# Patient Record
Sex: Female | Born: 1945 | Race: White | Hispanic: No | Marital: Married | Smoking: Never smoker
Health system: Southern US, Community
[De-identification: ages and names within clinical notes are randomized; demographics above are authoritative.]

## PROBLEM LIST (undated history)

## (undated) DIAGNOSIS — Z87442 Personal history of urinary calculi: Secondary | ICD-10-CM

## (undated) DIAGNOSIS — E119 Type 2 diabetes mellitus without complications: Secondary | ICD-10-CM

## (undated) DIAGNOSIS — M1990 Unspecified osteoarthritis, unspecified site: Secondary | ICD-10-CM

## (undated) DIAGNOSIS — I10 Essential (primary) hypertension: Secondary | ICD-10-CM

## (undated) HISTORY — PX: TUBAL LIGATION: SHX77

## (undated) HISTORY — PX: TONSILLECTOMY: SUR1361

---

## 1992-11-28 HISTORY — PX: CHOLECYSTECTOMY: SHX55

## 2016-06-03 ENCOUNTER — Other Ambulatory Visit: Payer: Self-pay | Admitting: Orthopedic Surgery

## 2016-07-01 ENCOUNTER — Encounter (HOSPITAL_COMMUNITY): Payer: Self-pay

## 2016-07-01 ENCOUNTER — Other Ambulatory Visit (HOSPITAL_COMMUNITY): Payer: Self-pay | Admitting: *Deleted

## 2016-07-01 ENCOUNTER — Encounter (HOSPITAL_COMMUNITY)
Admission: RE | Admit: 2016-07-01 | Discharge: 2016-07-01 | Disposition: A | Discharge status: Home / Self Care | Payer: Medicare Other | Source: Ambulatory Visit | Attending: Orthopedic Surgery | Admitting: Orthopedic Surgery

## 2016-07-01 ENCOUNTER — Ambulatory Visit (HOSPITAL_COMMUNITY)
Admission: RE | Admit: 2016-07-01 | Discharge: 2016-07-01 | Disposition: A | Discharge status: Home / Self Care | Payer: Medicare Other | Source: Ambulatory Visit | Attending: Orthopedic Surgery | Admitting: Orthopedic Surgery

## 2016-07-01 DIAGNOSIS — I10 Essential (primary) hypertension: Secondary | ICD-10-CM | POA: Insufficient documentation

## 2016-07-01 DIAGNOSIS — Z01818 Encounter for other preprocedural examination: Secondary | ICD-10-CM | POA: Diagnosis present

## 2016-07-01 DIAGNOSIS — I493 Ventricular premature depolarization: Secondary | ICD-10-CM | POA: Insufficient documentation

## 2016-07-01 DIAGNOSIS — I878 Other specified disorders of veins: Secondary | ICD-10-CM | POA: Insufficient documentation

## 2016-07-01 DIAGNOSIS — I700 Atherosclerosis of aorta: Secondary | ICD-10-CM | POA: Diagnosis not present

## 2016-07-01 DIAGNOSIS — Z01812 Encounter for preprocedural laboratory examination: Secondary | ICD-10-CM | POA: Insufficient documentation

## 2016-07-01 DIAGNOSIS — R9431 Abnormal electrocardiogram [ECG] [EKG]: Secondary | ICD-10-CM | POA: Insufficient documentation

## 2016-07-01 DIAGNOSIS — I517 Cardiomegaly: Secondary | ICD-10-CM | POA: Insufficient documentation

## 2016-07-01 DIAGNOSIS — Z01810 Encounter for preprocedural cardiovascular examination: Secondary | ICD-10-CM | POA: Diagnosis not present

## 2016-07-01 HISTORY — DX: Unspecified osteoarthritis, unspecified site: M19.90

## 2016-07-01 HISTORY — DX: Type 2 diabetes mellitus without complications: E11.9

## 2016-07-01 HISTORY — DX: Essential (primary) hypertension: I10

## 2016-07-01 HISTORY — DX: Personal history of urinary calculi: Z87.442

## 2016-07-01 LAB — COMPREHENSIVE METABOLIC PANEL
ALBUMIN: 3.9 g/dL (ref 3.5–5.0)
ALT: 21 U/L (ref 14–54)
AST: 24 U/L (ref 15–41)
Alkaline Phosphatase: 83 U/L (ref 38–126)
Anion gap: 11 (ref 5–15)
BILIRUBIN TOTAL: 0.5 mg/dL (ref 0.3–1.2)
BUN: 28 mg/dL — AB (ref 6–20)
CHLORIDE: 103 mmol/L (ref 101–111)
CO2: 25 mmol/L (ref 22–32)
CREATININE: 1.33 mg/dL — AB (ref 0.44–1.00)
Calcium: 9.3 mg/dL (ref 8.9–10.3)
GFR calc Af Amer: 46 mL/min — ABNORMAL LOW (ref >60)
GFR calc non Af Amer: 40 mL/min — ABNORMAL LOW (ref >60)
GLUCOSE: 268 mg/dL — AB (ref 65–99)
POTASSIUM: 4.1 mmol/L (ref 3.5–5.1)
Sodium: 139 mmol/L (ref 135–145)
Total Protein: 6.9 g/dL (ref 6.5–8.1)

## 2016-07-01 LAB — URINALYSIS, ROUTINE W REFLEX MICROSCOPIC
BILIRUBIN URINE: NEGATIVE
Glucose, UA: 250 mg/dL — AB
HGB URINE DIPSTICK: NEGATIVE
KETONES UR: NEGATIVE mg/dL
NITRITE: NEGATIVE
PH: 5.5 (ref 5.0–8.0)
PROTEIN: 100 mg/dL — AB
SPECIFIC GRAVITY, URINE: 1.018 (ref 1.005–1.030)

## 2016-07-01 LAB — CBC WITH DIFFERENTIAL/PLATELET
BASOS PCT: 0 %
Basophils Absolute: 0 10*3/uL (ref 0.0–0.1)
EOS ABS: 0.4 10*3/uL (ref 0.0–0.7)
Eosinophils Relative: 3 %
HEMATOCRIT: 36.4 % (ref 36.0–46.0)
Hemoglobin: 11.5 g/dL — ABNORMAL LOW (ref 12.0–15.0)
LYMPHS ABS: 3.7 10*3/uL (ref 0.7–4.0)
Lymphocytes Relative: 26 %
MCH: 25.8 pg — ABNORMAL LOW (ref 26.0–34.0)
MCHC: 31.6 g/dL (ref 30.0–36.0)
MCV: 81.6 fL (ref 78.0–100.0)
MONO ABS: 1.4 10*3/uL — AB (ref 0.1–1.0)
Monocytes Relative: 10 %
NEUTROS ABS: 8.7 10*3/uL — AB (ref 1.7–7.7)
Neutrophils Relative %: 61 %
PLATELETS: 453 10*3/uL — AB (ref 150–400)
RBC: 4.46 MIL/uL (ref 3.87–5.11)
RDW: 13.8 % (ref 11.5–15.5)
WBC: 14.2 10*3/uL — ABNORMAL HIGH (ref 4.0–10.5)

## 2016-07-01 LAB — PROTIME-INR
INR: 1.01
Prothrombin Time: 13.3 seconds (ref 11.4–15.2)

## 2016-07-01 LAB — SURGICAL PCR SCREEN
MRSA, PCR: NEGATIVE
Staphylococcus aureus: NEGATIVE

## 2016-07-01 LAB — GLUCOSE, CAPILLARY: Glucose-Capillary: 241 mg/dL — ABNORMAL HIGH (ref 65–99)

## 2016-07-01 LAB — URINE MICROSCOPIC-ADD ON: RBC / HPF: NONE SEEN RBC/hpf (ref 0–5)

## 2016-07-01 NOTE — Pre-Procedure Instructions (Addendum)
Brandy Cunningham  07/01/2016      [facility] - [place], [place] - [address] [place] [address] Phone: [phone] Fax: [phone]    Your procedure is scheduled on Monday, July 11, 2016 .  Report to [facility] Entrance "A" Admitting Office at 6:30 AM.  Call this number if you have problems the morning of surgery: [phone]  Any questions prior to day of surgery, please call [phone] between 8 & 4 PM.   Remember:  Do not eat food or drink liquids after midnight Sunday, 07/10/16.  Take these medicines the morning of surgery with A SIP OF WATER: , Cetirizine (Zyrtec)  Stop Herbal medications and all vitamins/over the counter supplements 7 days prior to surgery(07/04/16). Do not use Aspirin Products or NSAIDS (Aleve, Ibuprofen, etc.) 7 days prior to surgery.    How to Manage Your Diabetes Before Surgery   Why is it important to control my blood sugar before and after surgery?   Improving blood sugar levels before and after surgery helps healing and can limit problems.  A way of improving blood sugar control is eating a healthy diet by:  - Eating less sugar and carbohydrates  - Increasing activity/exercise  - Talk with your doctor about reaching your blood sugar goals  High blood sugars (greater than 180 mg/dL) can raise your risk of infections and slow down your recovery so you will need to focus on controlling your diabetes during the weeks before surgery.  Make sure that the doctor who takes care of your diabetes knows about your planned surgery including the date and location.  How do I manage my blood sugars before surgery?   Check your blood sugar at least 4 times a day, 2 days before surgery to make sure that they are not too high or low.  Check your blood sugar the morning of your surgery when you wake up and every 2 hours until you get to the Short-Stay unit.  Treat a low blood sugar  (less than 70 mg/dL) with 1/2 cup of clear juice (cranberry or apple), 4 glucose tablets, OR glucose gel.  Recheck blood sugar in 15 minutes after treatment (to make sure it is greater than 70 mg/dL).  If blood sugar is not greater than 70 mg/dL on re-check, call [phone] for further instructions.   Report your blood sugar to the Short-Stay nurse when you get to Short-Stay.  References:  [facility], 2007 "How to Manage your Diabetes Before and After Surgery".  What do I do about my diabetes medications?   Do not take oral diabetes medicines (pills) the morning of surgery.(no metformin)    THE NIGHT BEFORE SURGERY, take 30 units of Lantus Insulin, REG LUNCH DOSE  .     Do not wear jewelry, make-up or nail polish.  Do not wear lotions, powders, or perfumes.  You may wear deodorant.  Do not shave 48 hours prior to surgery.    Do not bring valuables to the hospital.  [facility] is not responsible for any belongings or valuables.  Contacts, dentures or bridgework may not be worn into surgery.  Leave your suitcase in the car.  After surgery it may be brought to your room.  For patients admitted to the hospital, discharge time will be determined by your treatment team.  Special instructions:  Brandy Cunningham - Preparing for Surgery  Before  surgery, you can play an important role.  Because skin is not sterile, your skin needs to be as free of germs as possible.  You can reduce the number of germs on you skin by washing with CHG (chlorahexidine gluconate) soap before surgery.  CHG is an antiseptic cleaner which kills germs and bonds with the skin to continue killing germs even after washing.  Please DO NOT use if you have an allergy to CHG or antibacterial soaps.  If your skin becomes reddened/irritated stop using the CHG and inform your nurse when you arrive at Short Stay.  Do not shave (including legs and underarms) for at least 48 hours prior to the first  CHG shower.  You may shave your face.  Please follow these instructions carefully:   1.  Shower with CHG Soap the night before surgery and the                                morning of Surgery.  2.  If you choose to wash your hair, wash your hair first as usual with your       normal shampoo.  3.  After you shampoo, rinse your hair and body thoroughly to remove the shampoo.  4.  Use CHG as you would any other liquid soap.  You can apply chg directly       to the skin and wash gently with scrungie or a clean washcloth.  5.  Apply the CHG Soap to your body ONLY FROM THE NECK DOWN.        Do not use on open wounds or open sores.  Avoid contact with your eyes, ears, mouth and genitals (private parts).  Wash genitals (private parts) with your normal soap.  6.  Wash thoroughly, paying special attention to the area where your surgery        will be performed.  7.  Thoroughly rinse your body with warm water from the neck down.  8.  DO NOT shower/wash with your normal soap after using and rinsing off       the CHG Soap.  9.  Pat yourself dry with a clean towel.            10.  Wear clean pajamas.            11.  Place clean sheets on your bed the night of your first shower and do not        sleep with pets.  Day of Surgery  Do not apply any lotions the morning of surgery.  Please wear clean clothes to the hospital.   Please read over the following fact sheets that you were given. Pain Booklet, Coughing and Deep Breathing, MRSA Information and Surgical Site Infection Prevention

## 2016-07-02 LAB — URINE CULTURE

## 2016-07-02 LAB — HEMOGLOBIN A1C
Hgb A1c MFr Bld: 7.8 % — ABNORMAL HIGH (ref 4.8–5.6)
Mean Plasma Glucose: 177 mg/dL

## 2016-07-05 NOTE — Progress Notes (Signed)
Anesthesia Chart Review:  Pt is a 70 year old female scheduled for R total knee arthroplasty on 07/11/2016 with Dannielle HuhSteve Lucey, MD.   PMH includes:  HTN, DM.  Never smoker.   Medications include: amlodipine, iron, humulin 70/30, hctz, lantus, lisinopril, metformin, simvastatin.   Preoperative labs reviewed.  HgbA1c 7.8, glucose 268  Chest x-ray 07/01/16 reviewed. 1. Mild cardiomegaly and mild pulmonary vascular congestion. 2. Aortic atherosclerosis.  EKG 07/01/16: Sinus rhythm with occasional PVCs. Low voltage QRS. ST & T wave abnormality, consider lateral ischemia  Reviewed with Dr. Maple HudsonMoser.   If no changes, I anticipate pt can proceed with surgery as scheduled.   Rica Mastngela Kabbe, FNP-BC [facility]/Anesthesiology Phone: [phone] 07/05/2016 5:15 PM

## 2016-07-08 MED ORDER — BUPIVACAINE LIPOSOME 1.3 % IJ SUSP
20.0000 mL | INTRAMUSCULAR | Status: AC
  Administered 2016-07-11: 20 mL
  Filled 2016-07-08: qty 20

## 2016-07-08 MED ORDER — TRANEXAMIC ACID 1000 MG/10ML IV SOLN
1000.0000 mg | INTRAVENOUS | Status: AC
  Administered 2016-07-11: 1000 mg via INTRAVENOUS
  Filled 2016-07-08: qty 10

## 2016-07-10 MED ORDER — CEFAZOLIN SODIUM-DEXTROSE 2-4 GM/100ML-% IV SOLN
2.0000 g | INTRAVENOUS | Status: AC
Start: 2016-07-11 — End: 2016-07-11
  Administered 2016-07-11: 2 g via INTRAVENOUS
  Filled 2016-07-10: qty 100

## 2016-07-10 NOTE — Anesthesia Preprocedure Evaluation (Addendum)
Anesthesia Evaluation  Patient identified by MRN, date of birth, ID band Patient awake    Reviewed: Allergy & Precautions, NPO status , Patient's Chart, lab work & pertinent test results  Airway Mallampati: I  TM Distance: >3 FB Neck ROM: Full    Dental  (+) Teeth Intact, Dental Advisory Given, Missing   Pulmonary neg pulmonary ROS,    breath sounds clear to auscultation       Cardiovascular hypertension, Pt. on medications  Rhythm:Regular Rate:Normal     Neuro/Psych negative neurological ROS  negative psych ROS   GI/Hepatic negative GI ROS, Neg liver ROS,   Endo/Other  diabetes, Type 2, Oral Hypoglycemic Agents  Renal/GU   negative genitourinary   Musculoskeletal  (+) Arthritis , Osteoarthritis,    Abdominal   Peds negative pediatric ROS (+)  Hematology negative hematology ROS (+)   Anesthesia Other Findings   Reproductive/Obstetrics negative OB ROS                            Lab Results  Component Value Date   WBC 14.2 (H) 07/01/2016   HGB 11.5 (L) 07/01/2016   HCT 36.4 07/01/2016   MCV 81.6 07/01/2016   PLT 453 (H) 07/01/2016   Lab Results  Component Value Date   INR 1.01 07/01/2016   06/2016 EKG: normal sinus rhythm, occasional PVC noted, unifocal.   Anesthesia Physical Anesthesia Plan  ASA: III  Anesthesia Plan: Spinal   Post-op Pain Management:    Induction: Intravenous  Airway Management Planned: Natural Airway  Additional Equipment:   Intra-op Plan:   Post-operative Plan:   Informed Consent: I have reviewed the patients History and Physical, chart, labs and discussed the procedure including the risks, benefits and alternatives for the proposed anesthesia with the patient or authorized representative who has indicated his/her understanding and acceptance.   Dental advisory given  Plan Discussed with: CRNA  Anesthesia Plan Comments:          Anesthesia Quick Evaluation

## 2016-07-11 ENCOUNTER — Inpatient Hospital Stay (HOSPITAL_COMMUNITY): Payer: Medicare Other | Admitting: Anesthesiology

## 2016-07-11 ENCOUNTER — Inpatient Hospital Stay (HOSPITAL_COMMUNITY)
Admission: RE | Admit: 2016-07-11 | Discharge: 2016-07-12 | DRG: 470 | Disposition: A | Discharge status: Home Health | Payer: Medicare Other | Source: Ambulatory Visit | Attending: Orthopedic Surgery | Admitting: Orthopedic Surgery

## 2016-07-11 ENCOUNTER — Encounter (HOSPITAL_COMMUNITY): Payer: Self-pay | Admitting: *Deleted

## 2016-07-11 ENCOUNTER — Encounter (HOSPITAL_COMMUNITY)
Admission: RE | Disposition: A | Discharge status: Home Health | Payer: Self-pay | Source: Ambulatory Visit | Attending: Orthopedic Surgery

## 2016-07-11 ENCOUNTER — Inpatient Hospital Stay (HOSPITAL_COMMUNITY): Payer: Medicare Other | Admitting: Emergency Medicine

## 2016-07-11 DIAGNOSIS — Z881 Allergy status to other antibiotic agents status: Secondary | ICD-10-CM

## 2016-07-11 DIAGNOSIS — Z885 Allergy status to narcotic agent status: Secondary | ICD-10-CM | POA: Diagnosis not present

## 2016-07-11 DIAGNOSIS — F17290 Nicotine dependence, other tobacco product, uncomplicated: Secondary | ICD-10-CM | POA: Diagnosis present

## 2016-07-11 DIAGNOSIS — M1711 Unilateral primary osteoarthritis, right knee: Secondary | ICD-10-CM | POA: Diagnosis present

## 2016-07-11 DIAGNOSIS — Z79899 Other long term (current) drug therapy: Secondary | ICD-10-CM

## 2016-07-11 DIAGNOSIS — D62 Acute posthemorrhagic anemia: Secondary | ICD-10-CM | POA: Diagnosis not present

## 2016-07-11 DIAGNOSIS — I10 Essential (primary) hypertension: Secondary | ICD-10-CM | POA: Diagnosis present

## 2016-07-11 DIAGNOSIS — Z794 Long term (current) use of insulin: Secondary | ICD-10-CM

## 2016-07-11 DIAGNOSIS — E119 Type 2 diabetes mellitus without complications: Secondary | ICD-10-CM | POA: Diagnosis present

## 2016-07-11 DIAGNOSIS — Z96659 Presence of unspecified artificial knee joint: Secondary | ICD-10-CM

## 2016-07-11 HISTORY — PX: TOTAL KNEE ARTHROPLASTY: SHX125

## 2016-07-11 LAB — GLUCOSE, CAPILLARY
GLUCOSE-CAPILLARY: 221 mg/dL — AB (ref 65–99)
Glucose-Capillary: 173 mg/dL — ABNORMAL HIGH (ref 65–99)
Glucose-Capillary: 261 mg/dL — ABNORMAL HIGH (ref 65–99)
Glucose-Capillary: 165 mg/dL — ABNORMAL HIGH (ref 65–99)
Glucose-Capillary: 217 mg/dL — ABNORMAL HIGH (ref 65–99)

## 2016-07-11 SURGERY — ARTHROPLASTY, KNEE, TOTAL
Anesthesia: Spinal | Site: Knee | Laterality: Right

## 2016-07-11 MED ORDER — LISINOPRIL 40 MG PO TABS
40.0000 mg | ORAL_TABLET | Freq: Every day | ORAL | Status: DC
  Administered 2016-07-11 – 2016-07-12 (×2): 40 mg via ORAL
  Filled 2016-07-11 (×2): qty 1

## 2016-07-11 MED ORDER — PROPOFOL 1000 MG/100ML IV EMUL
INTRAVENOUS | Status: AC
  Filled 2016-07-11: qty 200

## 2016-07-11 MED ORDER — INSULIN ASPART 100 UNIT/ML ~~LOC~~ SOLN
0.0000 [IU] | Freq: Three times a day (TID) | SUBCUTANEOUS | Status: DC
  Administered 2016-07-11: 8 [IU] via SUBCUTANEOUS
  Administered 2016-07-11 – 2016-07-12 (×2): 5 [IU] via SUBCUTANEOUS
  Administered 2016-07-12: 3 [IU] via SUBCUTANEOUS

## 2016-07-11 MED ORDER — SIMVASTATIN 40 MG PO TABS
40.0000 mg | ORAL_TABLET | Freq: Every day | ORAL | Status: DC
  Administered 2016-07-11: 40 mg via ORAL
  Filled 2016-07-11: qty 1

## 2016-07-11 MED ORDER — TRANEXAMIC ACID 1000 MG/10ML IV SOLN
1000.0000 mg | Freq: Once | INTRAVENOUS | Status: AC
  Administered 2016-07-11: 1000 mg via INTRAVENOUS
  Filled 2016-07-11 (×2): qty 10

## 2016-07-11 MED ORDER — SODIUM CHLORIDE 0.9 % IJ SOLN
INTRAMUSCULAR | Status: DC | PRN
  Administered 2016-07-11: 20 mL

## 2016-07-11 MED ORDER — BUPIVACAINE IN DEXTROSE 0.75-8.25 % IT SOLN
INTRATHECAL | Status: DC | PRN
  Administered 2016-07-11: 2 mL via INTRATHECAL

## 2016-07-11 MED ORDER — BUPIVACAINE-EPINEPHRINE (PF) 0.25% -1:200000 IJ SOLN
INTRAMUSCULAR | Status: AC
  Filled 2016-07-11: qty 30

## 2016-07-11 MED ORDER — OXYCODONE HCL ER 10 MG PO T12A
10.0000 mg | EXTENDED_RELEASE_TABLET | Freq: Two times a day (BID) | ORAL | Status: DC
  Administered 2016-07-11 – 2016-07-12 (×3): 10 mg via ORAL
  Filled 2016-07-11 (×3): qty 1

## 2016-07-11 MED ORDER — ASPIRIN EC 325 MG PO TBEC
325.0000 mg | DELAYED_RELEASE_TABLET | Freq: Two times a day (BID) | ORAL | Status: DC
  Administered 2016-07-11 – 2016-07-12 (×2): 325 mg via ORAL
  Filled 2016-07-11 (×2): qty 1

## 2016-07-11 MED ORDER — PROPOFOL 500 MG/50ML IV EMUL
INTRAVENOUS | Status: DC | PRN
Start: 2016-07-11 — End: 2016-07-11
  Administered 2016-07-11: 75 ug/kg/min via INTRAVENOUS

## 2016-07-11 MED ORDER — ONDANSETRON HCL 4 MG/2ML IJ SOLN
INTRAMUSCULAR | Status: AC
  Filled 2016-07-11: qty 2

## 2016-07-11 MED ORDER — CHLORHEXIDINE GLUCONATE 4 % EX LIQD: 60.0000 mL | Freq: Once | CUTANEOUS | Status: DC

## 2016-07-11 MED ORDER — PROMETHAZINE HCL 25 MG/ML IJ SOLN: 6.2500 mg | INTRAMUSCULAR | Status: DC | PRN

## 2016-07-11 MED ORDER — AMLODIPINE BESYLATE 10 MG PO TABS
10.0000 mg | ORAL_TABLET | Freq: Every day | ORAL | Status: DC
  Administered 2016-07-11 – 2016-07-12 (×2): 10 mg via ORAL
  Filled 2016-07-11 (×2): qty 1

## 2016-07-11 MED ORDER — BUPIVACAINE-EPINEPHRINE (PF) 0.5% -1:200000 IJ SOLN
INTRAMUSCULAR | Status: DC | PRN
  Administered 2016-07-11: 30 mL via PERINEURAL

## 2016-07-11 MED ORDER — DEXTROSE 5 % IV SOLN
500.0000 mg | Freq: Four times a day (QID) | INTRAVENOUS | Status: DC | PRN
  Filled 2016-07-11: qty 5

## 2016-07-11 MED ORDER — SODIUM CHLORIDE 0.9 % IV SOLN
INTRAVENOUS | Status: DC
  Administered 2016-07-11: 18:00:00 via INTRAVENOUS

## 2016-07-11 MED ORDER — INSULIN ASPART 100 UNIT/ML ~~LOC~~ SOLN
0.0000 [IU] | Freq: Every day | SUBCUTANEOUS | Status: DC
  Administered 2016-07-11: 2 [IU] via SUBCUTANEOUS

## 2016-07-11 MED ORDER — ACETAMINOPHEN 500 MG PO TABS
1000.0000 mg | ORAL_TABLET | Freq: Four times a day (QID) | ORAL | Status: AC
  Administered 2016-07-11 – 2016-07-12 (×4): 1000 mg via ORAL
  Filled 2016-07-11 (×4): qty 2

## 2016-07-11 MED ORDER — ZOLPIDEM TARTRATE 5 MG PO TABS: 5.0000 mg | ORAL_TABLET | Freq: Every evening | ORAL | Status: DC | PRN

## 2016-07-11 MED ORDER — PHENYLEPHRINE HCL 10 MG/ML IJ SOLN
INTRAMUSCULAR | Status: DC | PRN
  Administered 2016-07-11 (×2): 80 ug via INTRAVENOUS
  Administered 2016-07-11 (×2): 40 ug via INTRAVENOUS
  Administered 2016-07-11: 120 ug via INTRAVENOUS
  Administered 2016-07-11: 160 ug via INTRAVENOUS

## 2016-07-11 MED ORDER — DEXAMETHASONE SODIUM PHOSPHATE 10 MG/ML IJ SOLN
10.0000 mg | Freq: Once | INTRAMUSCULAR | Status: AC
  Administered 2016-07-12: 10 mg via INTRAVENOUS
  Filled 2016-07-11: qty 1

## 2016-07-11 MED ORDER — FENTANYL CITRATE (PF) 100 MCG/2ML IJ SOLN
INTRAMUSCULAR | Status: DC | PRN
  Administered 2016-07-11 (×2): 25 ug via INTRAVENOUS

## 2016-07-11 MED ORDER — FENTANYL CITRATE (PF) 250 MCG/5ML IJ SOLN
INTRAMUSCULAR | Status: AC
  Filled 2016-07-11: qty 5

## 2016-07-11 MED ORDER — PROPOFOL 10 MG/ML IV BOLUS
INTRAVENOUS | Status: AC
  Filled 2016-07-11: qty 20

## 2016-07-11 MED ORDER — HYDROCHLOROTHIAZIDE 25 MG PO TABS
25.0000 mg | ORAL_TABLET | Freq: Every day | ORAL | Status: DC
  Administered 2016-07-11 – 2016-07-12 (×2): 25 mg via ORAL
  Filled 2016-07-11 (×2): qty 1

## 2016-07-11 MED ORDER — PROPOFOL 10 MG/ML IV BOLUS
INTRAVENOUS | Status: DC | PRN
  Administered 2016-07-11 (×2): 20 mg via INTRAVENOUS

## 2016-07-11 MED ORDER — ACETAMINOPHEN 650 MG RE SUPP: 650.0000 mg | Freq: Four times a day (QID) | RECTAL | Status: DC | PRN

## 2016-07-11 MED ORDER — SENNOSIDES-DOCUSATE SODIUM 8.6-50 MG PO TABS: 1.0000 | ORAL_TABLET | Freq: Every evening | ORAL | Status: DC | PRN

## 2016-07-11 MED ORDER — METHOCARBAMOL 500 MG PO TABS
ORAL_TABLET | ORAL | Status: AC
  Filled 2016-07-11: qty 1

## 2016-07-11 MED ORDER — MEPERIDINE HCL 25 MG/ML IJ SOLN: 6.2500 mg | INTRAMUSCULAR | Status: DC | PRN

## 2016-07-11 MED ORDER — 0.9 % SODIUM CHLORIDE (POUR BTL) OPTIME
TOPICAL | Status: DC | PRN
  Administered 2016-07-11: 1000 mL

## 2016-07-11 MED ORDER — METHOCARBAMOL 500 MG PO TABS
500.0000 mg | ORAL_TABLET | Freq: Four times a day (QID) | ORAL | Status: DC | PRN
  Administered 2016-07-11 – 2016-07-12 (×4): 500 mg via ORAL
  Filled 2016-07-11 (×3): qty 1

## 2016-07-11 MED ORDER — INSULIN ASPART 100 UNIT/ML ~~LOC~~ SOLN
4.0000 [IU] | Freq: Three times a day (TID) | SUBCUTANEOUS | Status: DC
  Administered 2016-07-11 – 2016-07-12 (×3): 4 [IU] via SUBCUTANEOUS

## 2016-07-11 MED ORDER — OXYCODONE HCL 5 MG PO TABS
ORAL_TABLET | ORAL | Status: AC
  Filled 2016-07-11: qty 2

## 2016-07-11 MED ORDER — ALUM & MAG HYDROXIDE-SIMETH 200-200-20 MG/5ML PO SUSP: 30.0000 mL | ORAL | Status: DC | PRN

## 2016-07-11 MED ORDER — BISACODYL 5 MG PO TBEC: 5.0000 mg | DELAYED_RELEASE_TABLET | Freq: Every day | ORAL | Status: DC | PRN

## 2016-07-11 MED ORDER — PHENOL 1.4 % MT LIQD: 1.0000 | OROMUCOSAL | Status: DC | PRN

## 2016-07-11 MED ORDER — ONDANSETRON HCL 4 MG PO TABS: 4.0000 mg | ORAL_TABLET | Freq: Four times a day (QID) | ORAL | Status: DC | PRN

## 2016-07-11 MED ORDER — ONDANSETRON HCL 4 MG/2ML IJ SOLN
INTRAMUSCULAR | Status: DC | PRN
  Administered 2016-07-11: 4 mg via INTRAVENOUS

## 2016-07-11 MED ORDER — BUPIVACAINE-EPINEPHRINE (PF) 0.25% -1:200000 IJ SOLN
INTRAMUSCULAR | Status: DC | PRN
  Administered 2016-07-11: 30 mL

## 2016-07-11 MED ORDER — METOCLOPRAMIDE HCL 5 MG PO TABS
5.0000 mg | ORAL_TABLET | Freq: Three times a day (TID) | ORAL | Status: DC | PRN
Start: 2016-07-11 — End: 2016-07-12

## 2016-07-11 MED ORDER — DIPHENHYDRAMINE HCL 12.5 MG/5ML PO ELIX: 12.5000 mg | ORAL_SOLUTION | ORAL | Status: DC | PRN

## 2016-07-11 MED ORDER — INSULIN GLARGINE 100 UNIT/ML ~~LOC~~ SOLN
60.0000 [IU] | Freq: Every day | SUBCUTANEOUS | Status: DC
  Administered 2016-07-11: 60 [IU] via SUBCUTANEOUS
  Filled 2016-07-11 (×2): qty 0.6

## 2016-07-11 MED ORDER — ACETAMINOPHEN 500 MG PO TABS
1000.0000 mg | ORAL_TABLET | Freq: Once | ORAL | Status: AC
  Administered 2016-07-11: 1000 mg via ORAL
  Filled 2016-07-11: qty 2

## 2016-07-11 MED ORDER — METFORMIN HCL 500 MG PO TABS
1000.0000 mg | ORAL_TABLET | Freq: Two times a day (BID) | ORAL | Status: DC
  Administered 2016-07-11 – 2016-07-12 (×2): 1000 mg via ORAL
  Filled 2016-07-11 (×2): qty 2

## 2016-07-11 MED ORDER — HYDROMORPHONE HCL 1 MG/ML IJ SOLN
1.0000 mg | INTRAMUSCULAR | Status: DC | PRN
  Administered 2016-07-11 (×2): 1 mg via INTRAVENOUS
  Filled 2016-07-11 (×2): qty 1

## 2016-07-11 MED ORDER — ONDANSETRON HCL 4 MG/2ML IJ SOLN
4.0000 mg | Freq: Four times a day (QID) | INTRAMUSCULAR | Status: DC | PRN
  Administered 2016-07-11 – 2016-07-12 (×3): 4 mg via INTRAVENOUS
  Filled 2016-07-11 (×3): qty 2

## 2016-07-11 MED ORDER — DOCUSATE SODIUM 100 MG PO CAPS
100.0000 mg | ORAL_CAPSULE | Freq: Two times a day (BID) | ORAL | Status: DC
  Administered 2016-07-11 – 2016-07-12 (×2): 100 mg via ORAL
  Filled 2016-07-11 (×2): qty 1

## 2016-07-11 MED ORDER — MENTHOL 3 MG MT LOZG: 1.0000 | LOZENGE | OROMUCOSAL | Status: DC | PRN

## 2016-07-11 MED ORDER — PHENYLEPHRINE HCL 10 MG/ML IJ SOLN
INTRAVENOUS | Status: DC | PRN
  Administered 2016-07-11: 50 ug/min via INTRAVENOUS

## 2016-07-11 MED ORDER — INSULIN ASPART 100 UNIT/ML ~~LOC~~ SOLN
SUBCUTANEOUS | Status: AC
  Administered 2016-07-11: 2 [IU] via SUBCUTANEOUS
  Filled 2016-07-11: qty 1

## 2016-07-11 MED ORDER — LACTATED RINGERS IV SOLN
INTRAVENOUS | Status: DC | PRN
  Administered 2016-07-11 (×2): via INTRAVENOUS

## 2016-07-11 MED ORDER — LACTATED RINGERS IV SOLN: INTRAVENOUS | Status: DC

## 2016-07-11 MED ORDER — FLEET ENEMA 7-19 GM/118ML RE ENEM: 1.0000 | ENEMA | Freq: Once | RECTAL | Status: DC | PRN

## 2016-07-11 MED ORDER — MIDAZOLAM HCL 2 MG/2ML IJ SOLN
INTRAMUSCULAR | Status: AC
  Filled 2016-07-11: qty 2

## 2016-07-11 MED ORDER — ACETAMINOPHEN 325 MG PO TABS: 650.0000 mg | ORAL_TABLET | Freq: Four times a day (QID) | ORAL | Status: DC | PRN

## 2016-07-11 MED ORDER — CHLORHEXIDINE GLUCONATE 4 % EX LIQD
60.0000 mL | Freq: Once | CUTANEOUS | Status: DC
Start: 2016-07-11 — End: 2016-07-11

## 2016-07-11 MED ORDER — OXYCODONE HCL 5 MG PO TABS
5.0000 mg | ORAL_TABLET | ORAL | Status: DC | PRN
  Administered 2016-07-11 – 2016-07-12 (×4): 10 mg via ORAL
  Filled 2016-07-11 (×5): qty 2

## 2016-07-11 MED ORDER — SODIUM CHLORIDE 0.9 % IR SOLN
Status: DC | PRN
  Administered 2016-07-11: 1000 mL

## 2016-07-11 MED ORDER — METOCLOPRAMIDE HCL 5 MG/ML IJ SOLN
5.0000 mg | Freq: Three times a day (TID) | INTRAMUSCULAR | Status: DC | PRN
Start: 2016-07-11 — End: 2016-07-12
  Administered 2016-07-11 – 2016-07-12 (×2): 10 mg via INTRAVENOUS
  Filled 2016-07-11 (×2): qty 2

## 2016-07-11 MED ORDER — LIDOCAINE 2% (20 MG/ML) 5 ML SYRINGE
INTRAMUSCULAR | Status: AC
  Filled 2016-07-11: qty 5

## 2016-07-11 MED ORDER — HYDROMORPHONE HCL 1 MG/ML IJ SOLN
INTRAMUSCULAR | Status: AC
  Administered 2016-07-11: 1 mg via INTRAVENOUS
  Filled 2016-07-11: qty 1

## 2016-07-11 MED ORDER — MIDAZOLAM HCL 5 MG/5ML IJ SOLN
INTRAMUSCULAR | Status: DC | PRN
  Administered 2016-07-11: 2 mg via INTRAVENOUS

## 2016-07-11 MED ORDER — HYDROMORPHONE HCL 1 MG/ML IJ SOLN
0.2500 mg | INTRAMUSCULAR | Status: DC | PRN
  Administered 2016-07-11: 0.5 mg via INTRAVENOUS

## 2016-07-11 MED ORDER — CEFAZOLIN IN D5W 1 GM/50ML IV SOLN
1.0000 g | Freq: Four times a day (QID) | INTRAVENOUS | Status: AC
  Administered 2016-07-11 (×2): 1 g via INTRAVENOUS
  Filled 2016-07-11 (×2): qty 50

## 2016-07-11 SURGICAL SUPPLY — 62 items
BANDAGE ESMARK 6X9 LF (GAUZE/BANDAGES/DRESSINGS) ×1 IMPLANT
BLADE SAGITTAL 13X1.27X60 (BLADE) ×2 IMPLANT
BLADE SAGITTAL 13X1.27X60MM (BLADE) ×1
BLADE SAW SGTL 83.5X18.5 (BLADE) ×3 IMPLANT
BLADE SURG 10 STRL SS (BLADE) ×3 IMPLANT
BNDG ESMARK 6X9 LF (GAUZE/BANDAGES/DRESSINGS) ×3
BOWL SMART MIX CTS (DISPOSABLE) ×3 IMPLANT
CAPT KNEE TOTAL 3 ×3 IMPLANT
CEMENT BONE SIMPLEX SPEEDSET (Cement) ×6 IMPLANT
CLOSURE STERI-STRIP 1/2X4 (GAUZE/BANDAGES/DRESSINGS) ×1
CLSR STERI-STRIP ANTIMIC 1/2X4 (GAUZE/BANDAGES/DRESSINGS) ×2 IMPLANT
COVER SURGICAL LIGHT HANDLE (MISCELLANEOUS) ×3 IMPLANT
CUFF TOURNIQUET SINGLE 34IN LL (TOURNIQUET CUFF) ×3 IMPLANT
DRAPE EXTREMITY T 121X128X90 (DRAPE) ×3 IMPLANT
DRAPE INCISE IOBAN 66X45 STRL (DRAPES) ×6 IMPLANT
DRAPE PROXIMA HALF (DRAPES) IMPLANT
DRAPE U-SHAPE 47X51 STRL (DRAPES) ×3 IMPLANT
DRSG ADAPTIC 3X8 NADH LF (GAUZE/BANDAGES/DRESSINGS) IMPLANT
DRSG AQUACEL AG ADV 3.5X10 (GAUZE/BANDAGES/DRESSINGS) ×3 IMPLANT
DRSG PAD ABDOMINAL 8X10 ST (GAUZE/BANDAGES/DRESSINGS) ×3 IMPLANT
DURAPREP 26ML APPLICATOR (WOUND CARE) ×3 IMPLANT
ELECT REM PT RETURN 9FT ADLT (ELECTROSURGICAL) ×3
ELECTRODE REM PT RTRN 9FT ADLT (ELECTROSURGICAL) ×1 IMPLANT
GAUZE SPONGE 4X4 12PLY STRL (GAUZE/BANDAGES/DRESSINGS) IMPLANT
GLOVE BIOGEL M 7.0 STRL (GLOVE) IMPLANT
GLOVE BIOGEL PI IND STRL 7.5 (GLOVE) IMPLANT
GLOVE BIOGEL PI IND STRL 8.5 (GLOVE) ×5 IMPLANT
GLOVE BIOGEL PI INDICATOR 7.5 (GLOVE)
GLOVE BIOGEL PI INDICATOR 8.5 (GLOVE) ×10
GLOVE SURG ORTHO 8.0 STRL STRW (GLOVE) ×18 IMPLANT
GOWN STRL REUS W/ TWL LRG LVL3 (GOWN DISPOSABLE) ×1 IMPLANT
GOWN STRL REUS W/ TWL XL LVL3 (GOWN DISPOSABLE) ×2 IMPLANT
GOWN STRL REUS W/TWL 2XL LVL3 (GOWN DISPOSABLE) ×3 IMPLANT
GOWN STRL REUS W/TWL LRG LVL3 (GOWN DISPOSABLE) ×2
GOWN STRL REUS W/TWL XL LVL3 (GOWN DISPOSABLE) ×4
HANDPIECE INTERPULSE COAX TIP (DISPOSABLE) ×2
HOOD PEEL AWAY FACE SHEILD DIS (HOOD) ×9 IMPLANT
KIT BASIN OR (CUSTOM PROCEDURE TRAY) ×3 IMPLANT
KIT ROOM TURNOVER OR (KITS) ×3 IMPLANT
KNEE CAPITATED TOTAL 3 ×1 IMPLANT
MANIFOLD NEPTUNE II (INSTRUMENTS) ×3 IMPLANT
NEEDLE 22X1 1/2 (OR ONLY) (NEEDLE) ×15 IMPLANT
NS IRRIG 1000ML POUR BTL (IV SOLUTION) ×3 IMPLANT
PACK TOTAL JOINT (CUSTOM PROCEDURE TRAY) ×3 IMPLANT
PACK UNIVERSAL I (CUSTOM PROCEDURE TRAY) IMPLANT
PAD ARMBOARD 7.5X6 YLW CONV (MISCELLANEOUS) ×6 IMPLANT
PADDING CAST COTTON 6X4 STRL (CAST SUPPLIES) IMPLANT
SET HNDPC FAN SPRY TIP SCT (DISPOSABLE) ×1 IMPLANT
STAPLER VISISTAT 35W (STAPLE) IMPLANT
SUCTION FRAZIER HANDLE 10FR (MISCELLANEOUS) ×2
SUCTION TUBE FRAZIER 10FR DISP (MISCELLANEOUS) ×1 IMPLANT
SUT BONE WAX W31G (SUTURE) ×3 IMPLANT
SUT MNCRL AB 3-0 PS2 18 (SUTURE) ×3 IMPLANT
SUT VIC AB 0 CTB1 27 (SUTURE) ×6 IMPLANT
SUT VIC AB 1 CT1 27 (SUTURE) ×4
SUT VIC AB 1 CT1 27XBRD ANBCTR (SUTURE) ×2 IMPLANT
SUT VIC AB 2-0 CT1 27 (SUTURE) ×4
SUT VIC AB 2-0 CT1 TAPERPNT 27 (SUTURE) ×2 IMPLANT
SYR 20CC LL (SYRINGE) ×6 IMPLANT
TOWEL OR 17X24 6PK STRL BLUE (TOWEL DISPOSABLE) ×3 IMPLANT
TOWEL OR 17X26 10 PK STRL BLUE (TOWEL DISPOSABLE) ×3 IMPLANT
WATER STERILE IRR 1000ML POUR (IV SOLUTION) IMPLANT

## 2016-07-11 NOTE — Anesthesia Postprocedure Evaluation (Signed)
Anesthesia Post Note  Patient: Brandy LeighBrenda Cunningham  Procedure(s) Performed: Procedure(s) (LRB): TOTAL KNEE ARTHROPLASTY (Right)  Patient location during evaluation: PACU Anesthesia Type: Spinal Level of consciousness: oriented and awake and alert Pain management: pain level controlled Vital Signs Assessment: post-procedure vital signs reviewed and stable Respiratory status: spontaneous breathing, respiratory function stable and patient connected to nasal cannula oxygen Cardiovascular status: blood pressure returned to baseline and stable Postop Assessment: no headache and no backache Anesthetic complications: no    Last Vitals:  Vitals:   07/11/16 1100 07/11/16 1115  BP: (!) 138/98 (!) 137/54  Pulse: 65 72  Resp: 12 17  Temp: 36.6 C     Last Pain:  Vitals:   07/11/16 1148  PainSc: 2                  Shelton SilvasKevin D Hosam Mcfetridge

## 2016-07-11 NOTE — H&P (Signed)
Brandy LeighBrenda Ruegg MRN:  [id number] DOB/SEX:  08-Sep-1946/female  CHIEF COMPLAINT:  Painful right Knee  HISTORY: Patient is a 70 y.o. female presented with a history of pain in the right knee. Onset of symptoms was gradual starting a few years ago with gradually worsening course since that time. Patient has been treated conservatively with over-the-counter NSAIDs and activity modification. Patient currently rates pain in the knee at 10 out of 10 with activity. There is pain at night.  PAST MEDICAL HISTORY: There are no active problems to display for this patient.  Past Medical History:  Diagnosis Date  . Arthritis   . Diabetes mellitus without complication (HCC)   . History of kidney stones   . Hypertension    Past Surgical History:  Procedure Laterality Date  . CHOLECYSTECTOMY  1994  . TONSILLECTOMY    . TUBAL LIGATION       MEDICATIONS:   Prescriptions Prior to Admission  Medication Sig Dispense Refill Last Dose  . amLODipine (NORVASC) 10 MG tablet Take 1 tablet by mouth daily.  0 07/10/2016 at 1700  . cetirizine (ZYRTEC) 10 MG tablet Take 10 mg by mouth daily.   07/10/2016 at 2300  . CHERRY PO Take 1 tablet by mouth daily.   Past Week at Unknown time  . CINNAMON PO Take 1 tablet by mouth 2 (two) times daily.   Past Week at Unknown time  . Coenzyme Q10 (COQ10 PO) Take 1 capsule by mouth daily.   Past Week at Unknown time  . CRANBERRY PO Take 1 capsule by mouth daily.   Past Week at Unknown time  . ferrous sulfate 325 (65 FE) MG tablet Take 325 mg by mouth daily with breakfast.   Past Week at Unknown time  . HUMULIN 70/30 (70-30) 100 UNIT/ML injection Inject 30 Units into the skin 2 (two) times daily.  0 07/10/2016 at 1200  . hydrochlorothiazide (HYDRODIURIL) 25 MG tablet Take 1 tablet by mouth daily.  0 07/10/2016 at Unknown time  . LANTUS 100 UNIT/ML injection Inject 60 Units into the skin at bedtime.  0 07/10/2016 at Unknown time  . lisinopril (PRINIVIL,ZESTRIL) 20 MG tablet Take 2  tablets by mouth daily.  0 07/10/2016 at Unknown time  . metFORMIN (GLUCOPHAGE) 1000 MG tablet Take 1 tablet by mouth 2 (two) times daily.  0 07/10/2016 at Unknown time  . Multiple Vitamin (MULTIVITAMIN WITH MINERALS) TABS tablet Take 1 tablet by mouth daily.   Past Week at Unknown time  . OVER THE COUNTER MEDICATION Take 2 capsules by mouth daily. Liver and brain capsule   Past Week at Unknown time  . OVER THE COUNTER MEDICATION Take 1 tablet by mouth daily. Liver antioxidant   Past Week at Unknown time  . OVER THE COUNTER MEDICATION Take 2 capsules by mouth daily. Vein and circulation support   Past Week at Unknown time  . simvastatin (ZOCOR) 40 MG tablet Take 1 tablet by mouth daily.  0 07/10/2016 at Unknown time  . vitamin C (ASCORBIC ACID) 500 MG tablet Take 500 mg by mouth daily.   Past Week at Unknown time    ALLERGIES:   Allergies  Allergen Reactions  . Codeine Itching  . Doxycycline Nausea And Vomiting  . Erythromycin Nausea And Vomiting  . Tramadol Nausea And Vomiting    REVIEW OF SYSTEMS:  A comprehensive review of systems was negative except for: Musculoskeletal: positive for arthralgias and bone pain   FAMILY HISTORY:  History reviewed. No pertinent family history.  SOCIAL HISTORY:   Social History  Substance Use Topics  . Smoking status: Never Smoker  . Smokeless tobacco: Current User  . Alcohol use No     EXAMINATION:  Vital signs in last 24 hours: Temp:  [98.9 F (37.2 C)] 98.9 F (37.2 C) (08/14 0557) Pulse Rate:  [87] 87 (08/14 0557) Resp:  [18] 18 (08/14 0557) BP: (178)/(69) 178/69 (08/14 0557) SpO2:  [99 %] 99 % (08/14 0557) Weight:  [103 kg (227 lb)] 103 kg (227 lb) (08/14 0557)  BP (!) 178/69   Pulse 87   Temp 98.9 F (37.2 C)   Resp 18   Wt 103 kg (227 lb)   SpO2 99%   BMI (P) 38.96 kg/m   General Appearance:    Alert, cooperative, no distress, appears stated age  Head:    Normocephalic, without obvious abnormality, atraumatic  Eyes:     PERRL, conjunctiva/corneas clear, EOM's intact, fundi    benign, both eyes  Ears:    Normal TM's and external ear canals, both ears  Nose:   Nares normal, septum midline, mucosa normal, no drainage    or sinus tenderness  Throat:   Lips, mucosa, and tongue normal; teeth and gums normal  Neck:   Supple, symmetrical, trachea midline, no adenopathy;    thyroid:  no enlargement/tenderness/nodules; no carotid   bruit or JVD  Back:     Symmetric, no curvature, ROM normal, no CVA tenderness  Lungs:     Clear to auscultation bilaterally, respirations unlabored  Chest Wall:    No tenderness or deformity   Heart:    Regular rate and rhythm, S1 and S2 normal, no murmur, rub   or gallop  Breast Exam:    No tenderness, masses, or nipple abnormality  Abdomen:     Soft, non-tender, bowel sounds active all four quadrants,    no masses, no organomegaly  Genitalia:    Normal female without lesion, discharge or tenderness  Rectal:    Normal tone, no masses or tenderness;   guaiac negative stool  Extremities:   Extremities normal, atraumatic, no cyanosis or edema  Pulses:   2+ and symmetric all extremities  Skin:   Skin color, texture, turgor normal, no rashes or lesions  Lymph nodes:   Cervical, supraclavicular, and axillary nodes normal  Neurologic:   CNII-XII intact, normal strength, sensation and reflexes    throughout     Musculoskeletal:  ROM 0-120, Ligaments intact,  Imaging Review Plain radiographs demonstrate severe degenerative joint disease of the right knee. The overall alignment is neutral. The bone quality appears to be excellent for age and reported activity level.  Assessment/Plan: Primary osteoarthritis, right knee   The patient history, physical examination and imaging studies are consistent with advanced degenerative joint disease of the right knee. The patient has failed conservative treatment.  The clearance notes were reviewed.  After discussion with the patient it was felt that  Total Knee Replacement was indicated. The procedure,  risks, and benefits of total knee arthroplasty were presented and reviewed. The risks including but not limited to aseptic loosening, infection, blood clots, vascular injury, stiffness, patella tracking problems complications among others were discussed. The patient acknowledged the explanation, agreed to proceed with the plan. Guy SandiferColby Alan Robbins 07/11/2016, 6:22 AM

## 2016-07-11 NOTE — Progress Notes (Addendum)
Pt 5N06 Brandy Cunningham arrived to unit at 1139, had episode of nausea and vomiting upon arrival, around 30 mL of yellow emesis. Pt offered PRN Zofran but declined and stated she "felt better". Pt voided 200 mL of clear, yellow urine on bedpan. VSS. Continuous pulse ox placed. CPM machine taken off, pt now resting.   Margarito LinerStephanie M Sevon Rotert, RN

## 2016-07-11 NOTE — Progress Notes (Signed)
OT Cancellation Note  Patient Details Name: Burnard LeighBrenda Law MRN: [id number] DOB: [birth year]   Cancelled Treatment:    Reason Eval/Treat Not Completed: Patient declined, no reason specified Pt refusing OT eval at this time due to extreme fatigue post surgery and PT session.  Will plan to see in AM.  Josede Cicero, [facility] 07/11/2016, 3:28 PM

## 2016-07-11 NOTE — Progress Notes (Signed)
Orthopedic Tech Progress Note Patient Details:  Burnard LeighBrenda Vitrano 08/28/1946 [id number] Viewed order from doctor's order list CPM Right Knee CPM Right Knee: On Right Knee Flexion (Degrees): 90 Right Knee Extension (Degrees): 0 Additional Comments: trapeze bar patient helper   Nikki DomCrawford, [name] Benninger 07/11/2016, 9:51 AM

## 2016-07-11 NOTE — Transfer of Care (Signed)
Immediate Anesthesia Transfer of Care Note  Patient: Brandy Cunningham  Procedure(s) Performed: Procedure(s): TOTAL KNEE ARTHROPLASTY (Right)  Patient Location: PACU  Anesthesia Type:Spinal  Level of Consciousness:  sedated, patient cooperative and responds to stimulation  Airway & Oxygen Therapy:Patient Spontanous Breathing and Patient connected to face mask oxgen  Post-op Assessment:  Report given to PACU RN and Post -op Vital signs reviewed and stable  Post vital signs:  Reviewed and stable  Last Vitals:  Vitals:   07/11/16 0557 07/11/16 0917  BP: (!) 178/69 (P) 129/65  Pulse: 87   Resp: 18   Temp: 37.2 C (P) 36.3 C    Complications: No apparent anesthesia complications

## 2016-07-11 NOTE — Progress Notes (Signed)
notifed with dr Hart Rochesterhollis regarding pts bs=261 ok with to cover with dr lucy,s ss insulin coverage ane order carried out

## 2016-07-11 NOTE — Anesthesia Procedure Notes (Signed)
Anesthesia Regional Block:  Adductor canal block  Pre-Anesthetic Checklist: ,, timeout performed, Correct Patient, Correct Site, Correct Laterality, Correct Procedure, Correct Position, site marked, Risks and benefits discussed,  Surgical consent,  Pre-op evaluation,  At surgeon's request and post-op pain management  Laterality: Right  Prep: chloraprep       Needles:  Injection technique: Single-shot  Needle Type: Echogenic Needle     Needle Length: 9cm 9 cm Needle Gauge: 21 and 21 G    Additional Needles:  Procedures: ultrasound guided (picture in chart) Adductor canal block Narrative:  Start time: 07/11/2016 7:15 AM End time: 07/11/2016 7:18 AM Injection made incrementally with aspirations every 5 mL.  Performed by: Personally  Anesthesiologist: Shona SimpsonHOLLIS, Jossie Smoot D  Additional Notes: Pt tolerated well. No immediate complications noted.

## 2016-07-11 NOTE — Anesthesia Procedure Notes (Signed)
Spinal  Patient location during procedure: OR Start time: 07/11/2016 7:40 AM End time: 07/11/2016 7:45 AM Staffing Anesthesiologist: Suella Broad D Performed: resident/CRNA  Preanesthetic Checklist Completed: patient identified, site marked, surgical consent, pre-op evaluation, timeout performed, IV checked, risks and benefits discussed and monitors and equipment checked Spinal Block Patient position: sitting Prep: Betadine Patient monitoring: heart rate, continuous pulse ox, blood pressure and cardiac monitor Approach: midline Location: L4-5 Injection technique: single-shot Needle Needle type: Whitacre and Introducer  Needle gauge: 24 G Needle length: 9 cm Additional Notes Negative paresthesia. Negative blood return. Positive free-flowing CSF. Expiration date of kit checked and confirmed. Patient tolerated procedure well, without complications.

## 2016-07-11 NOTE — Evaluation (Signed)
Physical Therapy Evaluation Patient Details Name: Brandy Cunningham MRN: [id number] DOB: [birth year] Today's Date: 07/11/2016   History of Present Illness  Pt admitted for Rt TKA. PMHx: DM, HTN, arthritis  Clinical Impression  Pt moving well POD 0. Pt with roll under her knee on arrival and pt educated for nothing under knee. Pt educated for HEP, gait, transfers, bone foam and CPM use. Pt with decreased strength and functional mobility who will benefit from acute therapy to maximize independence prior to return home.     Follow Up Recommendations Home health PT    Equipment Recommendations  None recommended by PT    Recommendations for Other Services OT consult     Precautions / Restrictions Precautions Precautions: Knee Restrictions Weight Bearing Restrictions: Yes RLE Weight Bearing: Weight bearing as tolerated      Mobility  Bed Mobility Overal bed mobility: Needs Assistance Bed Mobility: Supine to Sit     Supine to sit: Supervision     General bed mobility comments: cues for sequence, no assist to move RLE  Transfers Overall transfer level: Needs assistance   Transfers: Sit to/from Stand Sit to Stand: Supervision         General transfer comment: cues for hand placement and sequence  Ambulation/Gait Ambulation/Gait assistance: Supervision Ambulation Distance (Feet): 100 Feet Assistive device: Rolling walker (2 wheeled) Gait Pattern/deviations: Step-to pattern;Decreased stance time - right   Gait velocity interpretation: Below normal speed for age/gender General Gait Details: cues for sequence and posture  Stairs            Wheelchair Mobility    Modified Rankin (Stroke Patients Only)       Balance Overall balance assessment: No apparent balance deficits (not formally assessed)                                           Pertinent Vitals/Pain Pain Assessment: 0-10 Pain Score: 2  Pain Location: Rt knee Pain Descriptors /  Indicators: Sore Pain Intervention(s): Premedicated before session;Monitored during session;Limited activity within patient's tolerance;Repositioned    Home Living Family/patient expects to be discharged to:: Private residence Living Arrangements: Spouse/significant other Available Help at Discharge: Family;Available 24 hours/day Type of Home: House Home Access: Stairs to enter   Entergy CorporationEntrance Stairs-Number of Steps: 3 Home Layout: One level Home Equipment: Walker - 2 wheels;Bedside commode;Cane - single point      Prior Function Level of Independence: Independent               Hand Dominance        Extremity/Trunk Assessment   Upper Extremity Assessment: Overall WFL for tasks assessed           Lower Extremity Assessment: RLE deficits/detail RLE Deficits / Details: decreased strength and ROM as expected post op    Cervical / Trunk Assessment: Normal  Communication   Communication: No difficulties  Cognition Arousal/Alertness: Awake/alert Behavior During Therapy: WFL for tasks assessed/performed Overall Cognitive Status: Within Functional Limits for tasks assessed                      General Comments      Exercises Total Joint Exercises Quad Sets: AROM;Right;5 reps;Supine Heel Slides: AAROM;Right;5 reps;Supine Straight Leg Raises: AROM;Right;5 reps;Supine      Assessment/Plan    PT Assessment Patient needs continued PT services  PT Diagnosis Difficulty walking;Acute pain   PT  Problem List Decreased strength;Decreased range of motion;Decreased activity tolerance;Decreased knowledge of use of DME;Pain;Decreased mobility  PT Treatment Interventions DME instruction;Gait training;Therapeutic activities;Therapeutic exercise;Stair training;Functional mobility training;Patient/family education   PT Goals (Current goals can be found in the Care Plan section) Acute Rehab PT Goals Patient Stated Goal: be able to walk without pain and shop at [employer] PT  Goal Formulation: With patient Time For Goal Achievement: 07/18/16 Potential to Achieve Goals: Good    Frequency 7X/week   Barriers to discharge        Co-evaluation               End of Session Equipment Utilized During Treatment: Gait belt Activity Tolerance: Patient tolerated treatment well Patient left: in chair;with call bell/phone within reach;with family/visitor present Nurse Communication: Mobility status         Time: [id number] PT Time Calculation (min) (ACUTE ONLY): 27 min   Charges:   PT Evaluation $PT Eval Moderate Complexity: 1 Procedure PT Treatments $Therapeutic Activity: 8-22 mins   PT G CodesDelorse Cunningham:        Brandy Cunningham 07/11/2016, 2:15 PM Brandy Cunningham, PT [phone]

## 2016-07-12 ENCOUNTER — Encounter (HOSPITAL_COMMUNITY): Payer: Self-pay | Admitting: Orthopedic Surgery

## 2016-07-12 LAB — CBC
HEMATOCRIT: 34.4 % — AB (ref 36.0–46.0)
HEMOGLOBIN: 10.1 g/dL — AB (ref 12.0–15.0)
MCH: 24.8 pg — ABNORMAL LOW (ref 26.0–34.0)
MCHC: 29.4 g/dL — AB (ref 30.0–36.0)
MCV: 84.3 fL (ref 78.0–100.0)
Platelets: 363 10*3/uL (ref 150–400)
RBC: 4.08 MIL/uL (ref 3.87–5.11)
RDW: 14 % (ref 11.5–15.5)
WBC: 13.5 10*3/uL — ABNORMAL HIGH (ref 4.0–10.5)

## 2016-07-12 LAB — GLUCOSE, CAPILLARY
GLUCOSE-CAPILLARY: 197 mg/dL — AB (ref 65–99)
Glucose-Capillary: 194 mg/dL — ABNORMAL HIGH (ref 65–99)
Glucose-Capillary: 248 mg/dL — ABNORMAL HIGH (ref 65–99)

## 2016-07-12 LAB — BASIC METABOLIC PANEL
Anion gap: 8 (ref 5–15)
BUN: 17 mg/dL (ref 6–20)
CALCIUM: 9.1 mg/dL (ref 8.9–10.3)
CHLORIDE: 102 mmol/L (ref 101–111)
CO2: 26 mmol/L (ref 22–32)
CREATININE: 1.24 mg/dL — AB (ref 0.44–1.00)
GFR calc Af Amer: 51 mL/min — ABNORMAL LOW (ref >60)
GFR calc non Af Amer: 44 mL/min — ABNORMAL LOW (ref >60)
GLUCOSE: 209 mg/dL — AB (ref 65–99)
Potassium: 4.9 mmol/L (ref 3.5–5.1)
Sodium: 136 mmol/L (ref 135–145)

## 2016-07-12 LAB — HEMOGLOBIN A1C
Hgb A1c MFr Bld: 8.1 % — ABNORMAL HIGH (ref 4.8–5.6)
Mean Plasma Glucose: 186 mg/dL

## 2016-07-12 MED ORDER — ASPIRIN 325 MG PO TBEC: 325.0000 mg | DELAYED_RELEASE_TABLET | Freq: Two times a day (BID) | ORAL | 0 refills | Status: AC

## 2016-07-12 MED ORDER — OXYCODONE HCL 5 MG PO TABS: 5.0000 mg | ORAL_TABLET | ORAL | 0 refills | Status: AC | PRN

## 2016-07-12 MED ORDER — METHOCARBAMOL 500 MG PO TABS: 500.0000 mg | ORAL_TABLET | Freq: Four times a day (QID) | ORAL | 0 refills | Status: AC | PRN

## 2016-07-12 NOTE — Progress Notes (Signed)
Physical Therapy Treatment Patient Details Name: Brandy Cunningham MRN: [id number] DOB: 1946/09/10 Today's Date: 07/12/2016    History of Present Illness Pt admitted for Rt TKA. PMHx: DM, HTN, arthritis    PT Comments    Pt with continued improvement in gait, exercise and transfers. Educated for transfers, stairs and HEP. Pt in bone foam end of session. Will continue to follow. Pt states walker at home does not have wheels.   Follow Up Recommendations  Home health PT     Equipment Recommendations  Rolling walker with 5" wheels    Recommendations for Other Services       Precautions / Restrictions Precautions Precautions: Knee Restrictions RLE Weight Bearing: Weight bearing as tolerated    Mobility  Bed Mobility Overal bed mobility: Needs Assistance Bed Mobility: Supine to Sit     Supine to sit: Supervision     General bed mobility comments: cues for sequence, no assist to move RLE, HOB elevated 20 degrees  Transfers Overall transfer level: Needs assistance   Transfers: Sit to/from Stand Sit to Stand: Supervision         General transfer comment: cues for hand placement and sequence  Ambulation/Gait Ambulation/Gait assistance: Supervision Ambulation Distance (Feet): 160 Feet Assistive device: Rolling walker (2 wheeled) Gait Pattern/deviations: Step-through pattern;Decreased stride length;Decreased stance time - right;Trunk flexed   Gait velocity interpretation: Below normal speed for age/gender General Gait Details: cues for sequence and posture   Stairs Stairs: Yes Stairs assistance: Min assist Stair Management: Backwards;With walker Number of Stairs: 2 General stair comments: cues for sequence and safety  Wheelchair Mobility    Modified Rankin (Stroke Patients Only)       Balance                                    Cognition Arousal/Alertness: Awake/alert Behavior During Therapy: WFL for tasks assessed/performed Overall  Cognitive Status: Within Functional Limits for tasks assessed                      Exercises Total Joint Exercises Heel Slides: AAROM;Right;Supine;10 reps Hip ABduction/ADduction: AROM;Right;10 reps;Supine Straight Leg Raises: AROM;Right;10 reps;Supine Long Arc Quad: Right;10 reps;Supine;AAROM    General Comments        Pertinent Vitals/Pain Pain Score: 3  Pain Location: Rt knee with movement Pain Descriptors / Indicators: Sore Pain Intervention(s): Limited activity within patient's tolerance;Premedicated before session;Repositioned    Home Living                      Prior Function            PT Goals (current goals can now be found in the care plan section) Progress towards PT goals: Progressing toward goals    Frequency       PT Plan Current plan remains appropriate;Equipment recommendations need to be updated    Co-evaluation             End of Session Equipment Utilized During Treatment: Gait belt Activity Tolerance: Patient tolerated treatment well Patient left: in chair;with call bell/phone within reach;with family/visitor present     Time: [id number] PT Time Calculation (min) (ACUTE ONLY): 53 min  Charges:  $Gait Training: 23-37 mins $Therapeutic Exercise: 8-22 mins $Therapeutic Activity: 8-22 mins                    G Codes:  Brandy Cunningham, Brandy Cunningham 07/12/2016, 8:41 AM Brandy Cunningham, PT [phone]

## 2016-07-12 NOTE — Progress Notes (Signed)
Pt ready for discharge. Education/instructions reviewed with pt and family, all questions/concerns addressed. IV removed and belongings gathered. Pt will be transported out via wheelchair to husband's car. Will continue to monitor

## 2016-07-12 NOTE — Discharge Summary (Signed)
SPORTS MEDICINE & JOINT REPLACEMENT   Georgena SpurlingStephen Lucey, MD   Laurier Nancyolby Robbins, PA-C [address], [place], [place]  [id number]                             [phone]  PATIENT ID: Brandy Cunningham        MRN:  [id number]          DOB/AGE: [birth year] / 70 y.o.    DISCHARGE SUMMARY  ADMISSION DATE:    07/11/2016 DISCHARGE DATE:   07/12/2016   ADMISSION DIAGNOSIS: PRIMARY OSTEOARTHRITIS RIGHT KNEE    DISCHARGE DIAGNOSIS:  PRIMARY OSTEOARTHRITIS RIGHT KNEE    ADDITIONAL DIAGNOSIS: Active Problems:   S/P total knee replacement  Past Medical History:  Diagnosis Date  . Arthritis   . Diabetes mellitus without complication (HCC)   . History of kidney stones   . Hypertension     PROCEDURE: Procedure(s): TOTAL KNEE ARTHROPLASTY on 07/11/2016  CONSULTS:    HISTORY:  See H&P in chart  HOSPITAL COURSE:  Brandy Cunningham is a 70 y.o. admitted on 07/11/2016 and found to have a diagnosis of PRIMARY OSTEOARTHRITIS RIGHT KNEE.  After appropriate laboratory studies were obtained  they were taken to the operating room on 07/11/2016 and underwent Procedure(s): TOTAL KNEE ARTHROPLASTY.   They were given perioperative antibiotics:  Anti-infectives    Start     Dose/Rate Route Frequency Ordered Stop   07/11/16 1300  ceFAZolin (ANCEF) IVPB 1 g/50 mL premix     1 g 100 mL/hr over 30 Minutes Intravenous Every 6 hours 07/11/16 1136 07/11/16 2218   07/11/16 0600  ceFAZolin (ANCEF) IVPB 2g/100 mL premix     2 g 200 mL/hr over 30 Minutes Intravenous On call to O.R. 07/10/16 1428 07/11/16 0736    .  Patient given tranexamic acid IV or topical and exparel intra-operatively.  Tolerated the procedure well.    POD# 1: Vital signs were stable.  Patient denied Chest pain, shortness of breath, or calf pain.  Patient was started on Lovenox 30 mg subcutaneously twice daily at 8am.  Consults to PT, OT, and care management were made.  The patient was weight bearing as tolerated.  CPM was placed on the  operative leg 0-90 degrees for 6-8 hours a day. When out of the CPM, patient was placed in the foam block to achieve full extension. Incentive spirometry was taught.  Dressing was changed.       POD #2, Continued  PT for ambulation and exercise program.  IV saline locked.  O2 discontinued.    The remainder of the hospital course was dedicated to ambulation and strengthening.   The patient was discharged on 1 Day Post-Op in  Good condition.  Blood products given:none  DIAGNOSTIC STUDIES: Recent vital signs: Patient Vitals for the past 24 hrs:  BP Temp Temp src Pulse Resp SpO2  07/12/16 0504 (!) 151/66 98.7 F (37.1 C) Oral 96 18 99 %  07/12/16 0209 124/64 98 F (36.7 C) Oral 78 18 99 %  07/11/16 2057 126/62 97.8 F (36.6 C) Oral 82 18 99 %  07/11/16 1500 (!) 124/58 97.7 F (36.5 C) Oral 78 18 98 %  07/11/16 1232 128/60 - - - - -       Recent laboratory studies:  Recent Labs  07/12/16 0548  WBC 13.5*  HGB 10.1*  HCT 34.4*  PLT 363    Recent Labs  07/12/16 0548  NA  136  K 4.9  CL 102  CO2 26  BUN 17  CREATININE 1.24*  GLUCOSE 209*  CALCIUM 9.1   Lab Results  Component Value Date   INR 1.01 07/01/2016     Recent Radiographic Studies :  Dg Chest 2 View  Result Date: 07/01/2016 CLINICAL DATA:  Pre right total knee replacement evaluation. EXAM: CHEST  2 VIEW COMPARISON:  None. FINDINGS: Mildly enlarged cardiac silhouette. Mildly prominent pulmonary vasculature. Clear lungs. Thoracic spine degenerative changes. Mild abdominal aortic calcification. Cholecystectomy clips. IMPRESSION: 1. Mild cardiomegaly and mild pulmonary vascular congestion. 2. Aortic atherosclerosis. Electronically Signed   By: Beckie Salts M.D.   On: 07/01/2016 13:59    DISCHARGE INSTRUCTIONS: Discharge Instructions    CPM    Complete by:  As directed   Continuous passive motion machine (CPM):      Use the CPM from 0 to 90 for 4-6 hours per day.      You may increase by 10 per day.  You may  break it up into 2 or 3 sessions per day.      Use CPM for 2 weeks or until you are told to stop.   Call MD / Call 911    Complete by:  As directed   If you experience chest pain or shortness of breath, CALL 911 and be transported to the hospital emergency room.  If you develope a fever above 101 F, pus (white drainage) or increased drainage or redness at the wound, or calf pain, call your surgeon's office.   Constipation Prevention    Complete by:  As directed   Drink plenty of fluids.  Prune juice may be helpful.  You may use a stool softener, such as Colace (over the counter) 100 mg twice a day.  Use MiraLax (over the counter) for constipation as needed.   Diet - low sodium heart healthy    Complete by:  As directed   Discharge instructions    Complete by:  As directed   INSTRUCTIONS AFTER JOINT REPLACEMENT   Remove items at home which could result in a fall. This includes throw rugs or furniture in walking pathways ICE to the affected joint every three hours while awake for 30 minutes at a time, for at least the first 3-5 days, and then as needed for pain and swelling.  Continue to use ice for pain and swelling. You may notice swelling that will progress down to the foot and ankle.  This is normal after surgery.  Elevate your leg when you are not up walking on it.   Continue to use the breathing machine you got in the hospital (incentive spirometer) which will help keep your temperature down.  It is common for your temperature to cycle up and down following surgery, especially at night when you are not up moving around and exerting yourself.  The breathing machine keeps your lungs expanded and your temperature down.   DIET:  As you were doing prior to hospitalization, we recommend a well-balanced diet.  DRESSING / WOUND CARE / SHOWERING  Keep the surgical dressing until follow up.  The dressing is water proof, so you can shower without any extra covering.  IF THE DRESSING FALLS OFF or the  wound gets wet inside, change the dressing with sterile gauze.  Please use good hand washing techniques before changing the dressing.  Do not use any lotions or creams on the incision until instructed by your surgeon.    ACTIVITY  Increase activity slowly as tolerated, but follow the weight bearing instructions below.   No driving for 6 weeks or until further direction given by your physician.  You cannot drive while taking narcotics.  No lifting or carrying greater than 10 lbs. until further directed by your surgeon. Avoid periods of inactivity such as sitting longer than an hour when not asleep. This helps prevent blood clots.  You may return to work once you are authorized by your doctor.     WEIGHT BEARING   Weight bearing as tolerated with assist device (walker, cane, etc) as directed, use it as long as suggested by your surgeon or therapist, typically at least 4-6 weeks.   EXERCISES  Results after joint replacement surgery are often greatly improved when you follow the exercise, range of motion and muscle strengthening exercises prescribed by your doctor. Safety measures are also important to protect the joint from further injury. Any time any of these exercises cause you to have increased pain or swelling, decrease what you are doing until you are comfortable again and then slowly increase them. If you have problems or questions, call your caregiver or physical therapist for advice.   Rehabilitation is important following a joint replacement. After just a few days of immobilization, the muscles of the leg can become weakened and shrink (atrophy).  These exercises are designed to build up the tone and strength of the thigh and leg muscles and to improve motion. Often times heat used for twenty to thirty minutes before working out will loosen up your tissues and help with improving the range of motion but do not use heat for the first two weeks following surgery (sometimes heat can increase  post-operative swelling).   These exercises can be done on a training (exercise) mat, on the floor, on a table or on a bed. Use whatever works the best and is most comfortable for you.    Use music or television while you are exercising so that the exercises are a pleasant break in your day. This will make your life better with the exercises acting as a break in your routine that you can look forward to.   Perform all exercises about fifteen times, three times per day or as directed.  You should exercise both the operative leg and the other leg as well.   Exercises include:   Quad Sets - Tighten up the muscle on the front of the thigh (Quad) and hold for 5-10 seconds.   Straight Leg Raises - With your knee straight (if you were given a brace, keep it on), lift the leg to 60 degrees, hold for 3 seconds, and slowly lower the leg.  Perform this exercise against resistance later as your leg gets stronger.  Leg Slides: Lying on your back, slowly slide your foot toward your buttocks, bending your knee up off the floor (only go as far as is comfortable). Then slowly slide your foot back down until your leg is flat on the floor again.  Angel Wings: Lying on your back spread your legs to the side as far apart as you can without causing discomfort.  Hamstring Strength:  Lying on your back, push your heel against the floor with your leg straight by tightening up the muscles of your buttocks.  Repeat, but this time bend your knee to a comfortable angle, and push your heel against the floor.  You may put a pillow under the heel to make it more comfortable if necessary.  A rehabilitation program following joint replacement surgery can speed recovery and prevent re-injury in the future due to weakened muscles. Contact your doctor or a physical therapist for more information on knee rehabilitation.    CONSTIPATION  Constipation is defined medically as fewer than three stools per week and severe constipation as  less than one stool per week.  Even if you have a regular bowel pattern at home, your normal regimen is likely to be disrupted due to multiple reasons following surgery.  Combination of anesthesia, postoperative narcotics, change in appetite and fluid intake all can affect your bowels.   YOU MUST use at least one of the following options; they are listed in order of increasing strength to get the job done.  They are all available over the counter, and you may need to use some, POSSIBLY even all of these options:    Drink plenty of fluids (prune juice may be helpful) and high fiber foods Colace 100 mg by mouth twice a day  Senokot for constipation as directed and as needed Dulcolax (bisacodyl), take with full glass of water  Miralax (polyethylene glycol) once or twice a day as needed.  If you have tried all these things and are unable to have a bowel movement in the first 3-4 days after surgery call either your surgeon or your primary doctor.    If you experience loose stools or diarrhea, hold the medications until you stool forms back up.  If your symptoms do not get better within 1 week or if they get worse, check with your doctor.  If you experience "the worst abdominal pain ever" or develop nausea or vomiting, please contact the office immediately for further recommendations for treatment.   ITCHING:  If you experience itching with your medications, try taking only a single pain pill, or even half a pain pill at a time.  You can also use Benadryl over the counter for itching or also to help with sleep.   TED HOSE STOCKINGS:  Use stockings on both legs until for at least 2 weeks or as directed by physician office. They may be removed at night for sleeping.  MEDICATIONS:  See your medication summary on the "After Visit Summary" that nursing will review with you.  You may have some home medications which will be placed on hold until you complete the course of blood thinner medication.  It is  important for you to complete the blood thinner medication as prescribed.  PRECAUTIONS:  If you experience chest pain or shortness of breath - call 911 immediately for transfer to the hospital emergency department.   If you develop a fever greater that 101 F, purulent drainage from wound, increased redness or drainage from wound, foul odor from the wound/dressing, or calf pain - CONTACT YOUR SURGEON.                                                   FOLLOW-UP APPOINTMENTS:  If you do not already have a post-op appointment, please call the office for an appointment to be seen by your surgeon.  Guidelines for how soon to be seen are listed in your "After Visit Summary", but are typically between 1-4 weeks after surgery.  OTHER INSTRUCTIONS:   Knee Replacement:  Do not place pillow under knee, focus on keeping the knee straight while resting.  CPM instructions: 0-90 degrees, 2 hours in the morning, 2 hours in the afternoon, and 2 hours in the evening. Place foam block, curve side up under heel at all times except when in CPM or when walking.  DO NOT modify, tear, cut, or change the foam block in any way.  MAKE SURE YOU:  Understand these instructions.  Get help right away if you are not doing well or get worse.    Thank you for letting us be a part of your medical care team.  It is a privilege we respect greatly.  We hope these instructions will help you stay on track for a fast and full recovery!   Driving restrictions    Complete by:  As directed   No driving for 4 weeks   Increase activity slowly as tolerated    Complete by:  As directed      DISCHARGE MEDICATIONS:     Medication List    TAKE these medications   amLODipine 10 MG tablet Commonly known as:  NORVASC Take 1 tablet by mouth daily.   aspirin 325 MG EC tablet Take 1 tablet (325 mg total) by mouth 2 (two) times daily.   cetirizine 10 MG tablet Commonly known as:  ZYRTEC Take 10 mg by mouth daily.   CHERRY PO Take 1  tablet by mouth daily.   CINNAMON PO Take 1 tablet by mouth 2 (two) times daily.   COQ10 PO Take 1 capsule by mouth daily.   CRANBERRY PO Take 1 capsule by mouth daily.   ferrous sulfate 325 (65 FE) MG tablet Take 325 mg by mouth daily with breakfast.   HUMULIN 70/30 (70-30) 100 UNIT/ML injection Generic drug:  insulin NPH-regular Human Inject 30 Units into the skin 2 (two) times daily.   hydrochlorothiazide 25 MG tablet Commonly known as:  HYDRODIURIL Take 1 tablet by mouth daily.   LANTUS 100 UNIT/ML injection Generic drug:  insulin glargine Inject 60 Units into the skin at bedtime.   lisinopril 20 MG tablet Commonly known as:  PRINIVIL,ZESTRIL Take 2 tablets by mouth daily.   metFORMIN 1000 MG tablet Commonly known as:  GLUCOPHAGE Take 1 tablet by mouth 2 (two) times daily.   methocarbamol 500 MG tablet Commonly known as:  ROBAXIN Take 1 tablet (500 mg total) by mouth every 6 (six) hours as needed for muscle spasms.   multivitamin with minerals Tabs tablet Take 1 tablet by mouth daily.   OVER THE COUNTER MEDICATION Take 2 capsules by mouth daily. Liver and brain capsule   OVER THE COUNTER MEDICATION Take 1 tablet by mouth daily. Liver antioxidant   OVER THE COUNTER MEDICATION Take 2 capsules by mouth daily. Vein and circulation support   oxyCODONE 5 MG immediate release tablet Commonly known as:  Oxy IR/ROXICODONE Take 1-2 tablets (5-10 mg total) by mouth every 4 (four) hours as needed for breakthrough pain.   simvastatin 40 MG tablet Commonly known as:  ZOCOR Take 1 tablet by mouth daily.   vitamin C 500 MG tablet Commonly known as:  ASCORBIC ACID Take 500 mg by mouth daily.       FOLLOW UP VISIT:   Follow-up Information    [facility] .   Why:  Someone from [facility]), will contact you to arrange start date and time for therapy. Contact information: [address] [place]  [address] [phone]           DISPOSITION: HOME VS. SNF  CONDITION:  Good   Guy Sandifer 07/12/2016, 12:02 PM

## 2016-07-12 NOTE — Care Management Note (Signed)
Case Management Note  Patient Details  Name: Brandy LeighBrenda Rodino MRN: [id number] Date of Birth: 20-May-1946  Subjective/Objective:   70 yr old female s/p right total knee arthroplasty.                Action/Plan: Case manager spoke with patient and family concerning home health and DME needs. Patient was preoperatively setup with [facility], no changes. Patient's family state they will be assisting her at discharge. She has 3in1, rolling walker has been ordered.    Expected Discharge Date:    07/12/16              Expected Discharge Plan:  Home w Home Health Services  In-House Referral:     Discharge planning Services  CM Consult  Post Acute Care Choice:  Durable Medical Equipment, Home Health Choice offered to:  Patient  DME Arranged:  Walker rolling DME Agency:  [facility]  HH Arranged:  PT HH Agency:  [facility] ([facility]  Status of Service:  Completed, signed off  If discussed at Long Length of Stay Meetings, dates discussed:    Additional Comments:  Durenda GuthrieBrady, Tae Robak Naomi, RN 07/12/2016, 11:42 AM

## 2016-07-12 NOTE — Progress Notes (Signed)
Physical Therapy Treatment Patient Details Name: Brandy LeighBrenda Cunningham MRN: [id number] DOB: [birth year] Today's Date: 07/12/2016    History of Present Illness Pt admitted for Rt TKA. PMHx: DM, HTN, arthritis    PT Comments    Pt continues to perform well but more fatigued this afternoon. Education for HEP, bed mobility and transfers provided. Pt returned to bed for lunch then anticipate CPM use after lunch. Will continue to follow.   Follow Up Recommendations  Home health PT     Equipment Recommendations  Rolling walker with 5" wheels    Recommendations for Other Services       Precautions / Restrictions Restrictions Weight Bearing Restrictions: Yes RLE Weight Bearing: Weight bearing as tolerated    Mobility  Bed Mobility Overal bed mobility: Needs Assistance Bed Mobility: Sit to Supine       Sit to supine: Min assist   General bed mobility comments: assist to bring RLE onto surface, cues for sequence, Unable to complete with only assist of LLE for RLE  Transfers Overall transfer level: Needs assistance   Transfers: Sit to/from Stand Sit to Stand: Supervision         General transfer comment: cues for hand placement and sequence  Ambulation/Gait Ambulation/Gait assistance: Supervision Ambulation Distance (Feet): 120 Feet Assistive device: Rolling walker (2 wheeled) Gait Pattern/deviations: Step-through pattern;Decreased stride length;Trunk flexed   Gait velocity interpretation: Below normal speed for age/gender General Gait Details: cues for sequence and posture   Stairs            Wheelchair Mobility    Modified Rankin (Stroke Patients Only)       Balance                                    Cognition Arousal/Alertness: Awake/alert Behavior During Therapy: WFL for tasks assessed/performed Overall Cognitive Status: Within Functional Limits for tasks assessed                      Exercises Total Joint Exercises Heel  Slides: AAROM;Right;10 reps;Seated Hip ABduction/ADduction: AROM;Right;10 reps;Seated Straight Leg Raises: AROM;Right;10 reps;Seated Long Arc Quad: Right;10 reps;AAROM;Seated Goniometric ROM: 9-65    General Comments        Pertinent Vitals/Pain Pain Score: 3  Pain Location: Rt posterior knee spasms at times Pain Descriptors / Indicators: Spasm Pain Intervention(s): Limited activity within patient's tolerance;Premedicated before session;Monitored during session;Repositioned    Home Living                      Prior Function            PT Goals (current goals can now be found in the care plan section) Progress towards PT goals: Progressing toward goals    Frequency       PT Plan Current plan remains appropriate    Co-evaluation             End of Session Equipment Utilized During Treatment: Gait belt Activity Tolerance: Patient tolerated treatment well Patient left: in bed;with call bell/phone within reach;with family/visitor present     Time: 7829-56211133-1216 PT Time Calculation (min) (ACUTE ONLY): 43 min  Charges:  $Gait Training: 8-22 mins $Therapeutic Exercise: 8-22 mins $Therapeutic Activity: 8-22 mins                    G Codes:      Delorse Lekabor, [facility] 07/12/2016, 12:36  PM Elwyn Reach, [place]

## 2016-07-12 NOTE — Progress Notes (Signed)
SPORTS MEDICINE AND JOINT REPLACEMENT  Stephen Lucey, MD    Laurier Nancyolby Robbins, PA-C [address], [place], [place]  [id number]                             [phone]   PROGRESS NOTE  Subjective:  negative for Chest Pain  negative for Shortness of Breath  negative for Nausea/Vomiting   negative for Calf Pain  negative for Bowel Movement   Tolerating Diet: yes         Patient reports pain as 5 on 0-10 scale.    Objective: Vital signs in last 24 hours:   Patient Vitals for the past 24 hrs:  BP Temp Temp src Pulse Resp SpO2  07/12/16 0504 (!) 151/66 98.7 F (37.1 C) Oral 96 18 99 %  07/12/16 0209 124/64 98 F (36.7 C) Oral 78 18 99 %  07/11/16 2057 126/62 97.8 F (36.6 C) Oral 82 18 99 %  07/11/16 1500 (!) 124/58 97.7 F (36.5 C) Oral 78 18 98 %  07/11/16 1232 128/60 - - - - -    @flow {1959:LAST@   Intake/Output from previous day:   08/14 0701 - 08/15 0700 In: 1540 [P.O.:240; I.V.:1300] Out: 280 [Urine:200]   Intake/Output this shift:   08/15 0701 - 08/15 1900 In: 240 [P.O.:240] Out: -    Intake/Output      08/14 0701 - 08/15 0700 08/15 0701 - 08/16 0700   P.O. 240 240   I.V. (mL/kg) 1300 (12.6)    Total Intake(mL/kg) 1540 (15) 240 (2.3)   Urine (mL/kg/hr) 200 (0.1)    Emesis/NG output 30 (0)    Blood 50 (0)    Total Output 280     Net +1260 +240        Urine Occurrence 5 x 1 x      LABORATORY DATA:  Recent Labs  07/12/16 0548  WBC 13.5*  HGB 10.1*  HCT 34.4*  PLT 363    Recent Labs  07/12/16 0548  NA 136  K 4.9  CL 102  CO2 26  BUN 17  CREATININE 1.24*  GLUCOSE 209*  CALCIUM 9.1   Lab Results  Component Value Date   INR 1.01 07/01/2016    Examination:  General appearance: alert, cooperative and no distress Extremities: extremities normal, atraumatic, no cyanosis or edema  Wound Exam: clean, dry, intact   Drainage:  Scant/small amount Serous exudate  Motor Exam: Quadriceps and Hamstrings Intact  Sensory Exam:  Superficial Peroneal, Deep Peroneal and Tibial normal   Assessment:    1 Day Post-Op  Procedure(s) (LRB): TOTAL KNEE ARTHROPLASTY (Right)  ADDITIONAL DIAGNOSIS:  Active Problems:   S/P total knee replacement  Acute Blood Loss Anemia   Plan: Physical Therapy as ordered Weight Bearing as Tolerated (WBAT)  DVT Prophylaxis:  Aspirin  DISCHARGE PLAN: Home  DISCHARGE NEEDS: HHPT   Patient is doing great, will D/C today         Guy SandiferColby Alan Robbins 07/12/2016, 11:58 AM

## 2016-07-12 NOTE — Progress Notes (Signed)
Orthopedic Tech Progress Note Patient Details:  Brandy Cunningham 09-Apr-1946 [id number]  Patient ID: Brandy Cunningham, female   DOB: 09-Apr-1946, 70 y.o.   MRN: [id number]   Brandy Cunningham, Brandy Cunningham 07/12/2016, 1:42 PM Placed pt's rle on cpm @0 -60 degrees @1340 

## 2016-07-12 NOTE — Op Note (Signed)
TOTAL KNEE REPLACEMENT OPERATIVE NOTE:  07/11/2016  7:43 AM  PATIENT:  Brandy Cunningham  70 y.o. female  PRE-OPERATIVE DIAGNOSIS:  PRIMARY OSTEOARTHRITIS RIGHT KNEE  POST-OPERATIVE DIAGNOSIS:  PRIMARY OSTEOARTHRITIS RIGHT KNEE  PROCEDURE:  Procedure(s): TOTAL KNEE ARTHROPLASTY  SURGEON:  Surgeon(s): Dannielle HuhSteve Leeman Johnsey, MD  PHYSICIAN ASSISTANT: Laurier Nancyolby Robbins, [facility]   ANESTHESIA:   spinal  DRAINS: Hemovac  SPECIMEN: None  COUNTS:  Correct  TOURNIQUET:   Total Tourniquet Time Documented: Thigh (Right) - 40 minutes Total: Thigh (Right) - 40 minutes   DICTATION:  Indication for procedure:    The patient is a 70 y.o. female who has failed conservative treatment for PRIMARY OSTEOARTHRITIS RIGHT KNEE.  Informed consent was obtained prior to anesthesia. The risks versus benefits of the operation were explain and in a way the patient can, and did, understand.   On the implant demand matching protocol, this patient scored 8.  Therefore, this patient did" "did not receive a polyethylene insert with vitamin E which is a high demand implant.  Description of procedure:     The patient was taken to the operating room and placed under anesthesia.  The patient was positioned in the usual fashion taking care that all body parts were adequately padded and/or protected.  I foley catheter was not placed.  A tourniquet was applied and the leg prepped and draped in the usual sterile fashion.  The extremity was exsanguinated with the esmarch and tourniquet inflated to 350 mmHg.  Pre-operative range of motion was 10-105 degrees flexion.  The knee was in 5 degree of significant varus.  A midline incision approximately 6-7 inches long was made with a #10 blade.  A new blade was used to make a parapatellar arthrotomy going 2-3 cm into the quadriceps tendon, over the patella, and alongside the medial aspect of the patellar tendon.  A synovectomy was then performed with the #10 blade and forceps. I then elevated  the deep MCL off the medial tibial metaphysis subperiosteally around to the semimembranosus attachment.    I everted the patella and used calipers to measure patellar thickness.  I used the reamer to ream down to appropriate thickness to recreate the native thickness.  I then removed excess bone with the rongeur and sagittal saw.  I used the appropriately sized template and drilled the three lug holes.  I then put the trial in place and measured the thickness with the calipers to ensure recreation of the native thickness.  The trial was then removed and the patella subluxed and the knee brought into flexion.  A homan retractor was place to retract and protect the patella and lateral structures.  A Z-retractor was place medially to protect the medial structures.  The extra-medullary alignment system was used to make cut the tibial articular surface perpendicular to the anamotic axis of the tibia and in 3 degrees of posterior slope.  The cut surface and alignment jig was removed.  I then used the intramedullary alignment guide to make a 6 valgus cut on the distal femur.  I then marked out the epicondylar axis on the distal femur.  The posterior condylar axis measured 3 degrees.  I then used the anterior referencing sizer and measured the femur to be a size 6.  The 4-In-1 cutting block was screwed into place in external rotation matching the posterior condylar angle, making our cuts perpendicular to the epicondylar axis.  Anterior, posterior and chamfer cuts were made with the sagittal saw.  The cutting block and  cut pieces were removed.  A lamina spreader was placed in 90 degrees of flexion.  The ACL, PCL, menisci, and posterior condylar osteophytes were removed.  A 10 mm spacer blocked was found to offer good flexion and extension gap balance after moderate in degree releasing.   The scoop retractor was then placed and the femoral finishing block was pinned in place.  The small sagittal saw was used as well  as the lug drill to finish the femur.  The block and cut surfaces were removed and the medullary canal hole filled with autograft bone from the cut pieces.  The tibia was delivered forward in deep flexion and external rotation.  A size D tray was selected and pinned into place centered on the medial 1/3 of the tibial tubercle.  The reamer and keel was used to prepare the tibia through the tray.    I then trialed with the size 6 femur, size D tibia, a 10 mm insert and the 32 patella.  I had excellent flexion/extension gap balance, excellent patella tracking.  Flexion was full and beyond 120 degrees; extension was zero.  These components were chosen and the staff opened them to me on the back table while the knee was lavaged copiously and the cement mixed.  The soft tissue was infiltrated with 60cc of exparel 1.3% through a 21 gauge needle.  I cemented in the components and removed all excess cement.  The polyethylene tibial component was snapped into place and the knee placed in extension while cement was hardening.  The capsule was infilltrated with 30cc of .25% Marcaine with epinephrine.  A hemovac was place in the joint exiting superolaterally.  A pain pump was place superomedially superficial to the arthrotomy.  Once the cement was hard, the tourniquet was let down.  Hemostasis was obtained.  The arthrotomy was closed with figure-8 #1 vicryl sutures.  The deep soft tissues were closed with #0 vicryls and the subcuticular layer closed with a running #2-0 vicryl.  The skin was reapproximated and closed with skin staples.  The wound was dressed with xeroform, 4 x4's, 2 ABD sponges, a single layer of webril and a TED stocking.   The patient was then awakened, extubated, and taken to the recovery room in stable condition.  BLOOD LOSS:  300cc DRAINS: 1 hemovac, 1 pain catheter COMPLICATIONS:  None.  PLAN OF CARE: Admit to inpatient   PATIENT DISPOSITION:  PACU - hemodynamically stable.   Delay start  of Pharmacological VTE agent (>24hrs) due to surgical blood loss or risk of bleeding:  not applicable  Please fax a copy of this op note to my office at [phone] (please only include page 1 and 2 of the Case Information op note)

## 2016-07-12 NOTE — Evaluation (Signed)
Occupational Therapy Evaluation and Discharge Patient Details Name: Brandy LeighBrenda Cunningham MRN: [id number] DOB: 1946/01/14 Today's Date: 07/12/2016    History of Present Illness Pt admitted for Rt TKA. PMHx: DM, HTN, arthritis   Clinical Impression   Pt and family educated in use of AE and compensatory strategies for LB bathing and dressing, multiple uses of 3 in 1, safe footwear and transporting items safely with RW. Instructed in technique for shower transfer. Pt with a supportive family who will provide 24 hour assist at home. No further OT needs.    Follow Up Recommendations  No OT follow up    Equipment Recommendations  None recommended by OT    Recommendations for Other Services       Precautions / Restrictions Precautions Precautions: Knee Precaution Comments: instructed in no pillow under R knee Restrictions Weight Bearing Restrictions: Yes RLE Weight Bearing: Weight bearing as tolerated      Mobility Bed Mobility Overal bed mobility: Needs Assistance Bed Mobility: Sit to Supine       Sit to supine: Min assist   General bed mobility comments: assist to bring RLE onto surface, cues for sequence, Unable to complete with only assist of LLE for RLE  Transfers Overall transfer level: Needs assistance   Transfers: Sit to/from Stand Sit to Stand: Supervision         General transfer comment: cues for hand placement and sequence    Balance                                            ADL Overall ADL's : Needs assistance/impaired Eating/Feeding: Independent;Bed level   Grooming: Wash/dry hands;Wash/dry face;Sitting;Set up   Upper Body Bathing: Set up;Sitting   Lower Body Bathing: Minimal assistance Lower Body Bathing Details (indicate cue type and reason): recommended long handled bath sponge Upper Body Dressing : Set up;Sitting   Lower Body Dressing: Minimal assistance;Sit to/from stand Lower Body Dressing Details (indicate cue type and  reason): educated in use of AE for LB dressing, safe footwear         Tub/ Shower Transfer: Walk-in shower;Ambulation Tub/Shower Transfer Details (indicate cue type and reason): educated in technique for shower transfer, walker placement and to have supervision for transfer.          Vision     Perception     Praxis      Pertinent Vitals/Pain Pain Assessment: Faces Pain Score: 3  Faces Pain Scale: Hurts little more Pain Location: R knee Pain Descriptors / Indicators: Sore Pain Intervention(s): Limited activity within patient's tolerance;Premedicated before session;Monitored during session;Repositioned     Hand Dominance Right   Extremity/Trunk Assessment Upper Extremity Assessment Upper Extremity Assessment: Overall WFL for tasks assessed   Lower Extremity Assessment Lower Extremity Assessment: Defer to PT evaluation   Cervical / Trunk Assessment Cervical / Trunk Assessment: Normal   Communication Communication Communication: No difficulties   Cognition Arousal/Alertness: Lethargic;Suspect due to medications Behavior During Therapy[facility] for tasks assessed/performed Overall Cognitive Status: Within Functional Limits for tasks assessed                     General Comments       Exercises       Shoulder Instructions      Home Living Family/patient expects to be discharged to:: Private residence Living Arrangements: Spouse/significant other Available Help at Discharge: Family;Available 24  hours/day Type of Home: House Home Access: Stairs to enter Entergy CorporationEntrance Stairs-Number of Steps: 3   Home Layout: One level     Bathroom Shower/Tub: Producer, television/film/videoWalk-in shower   Bathroom Toilet: Standard     Home Equipment: Environmental consultantWalker - 2 wheels;Bedside commode;Cane - single point;Adaptive equipment;Shower seat - built in (has access to a shower seat if needed) Adaptive Equipment: Reacher        Prior Functioning/Environment Level of Independence: Independent              OT Diagnosis: Generalized weakness;Acute pain   OT Problem List:     OT Treatment/Interventions:      OT Goals(Current goals can be found in the care plan section) Acute Rehab OT Goals Patient Stated Goal: be able to walk without pain and shop at [employer]  OT Frequency:     Barriers to D/C:            Co-evaluation              End of Session CPM Right Knee CPM Right Knee: Off Nurse Communication:  (pt ready for d/c)  Activity Tolerance: Patient tolerated treatment well Patient left: in bed;with call bell/phone within reach;with family/visitor present   Time: [id number] OT Time Calculation (min): 23 min Charges:  OT General Charges $OT Visit: 1 Procedure OT Evaluation $OT Eval Low Complexity: 1 Procedure OT Treatments $Self Care/Home Management : 8-22 mins G-Codes:    Evern BioMayberry, Talitha Dicarlo Lynn 07/12/2016, 2:28 PM  [phone]

## 2017-07-26 IMAGING — CR DG CHEST 2V
2 series · 2 of 2 positions shown · non-contrast
Comparison: None.

CLINICAL DATA: Pre right total knee replacement evaluation.

EXAM:
CHEST  2 VIEW

[w chest pa]
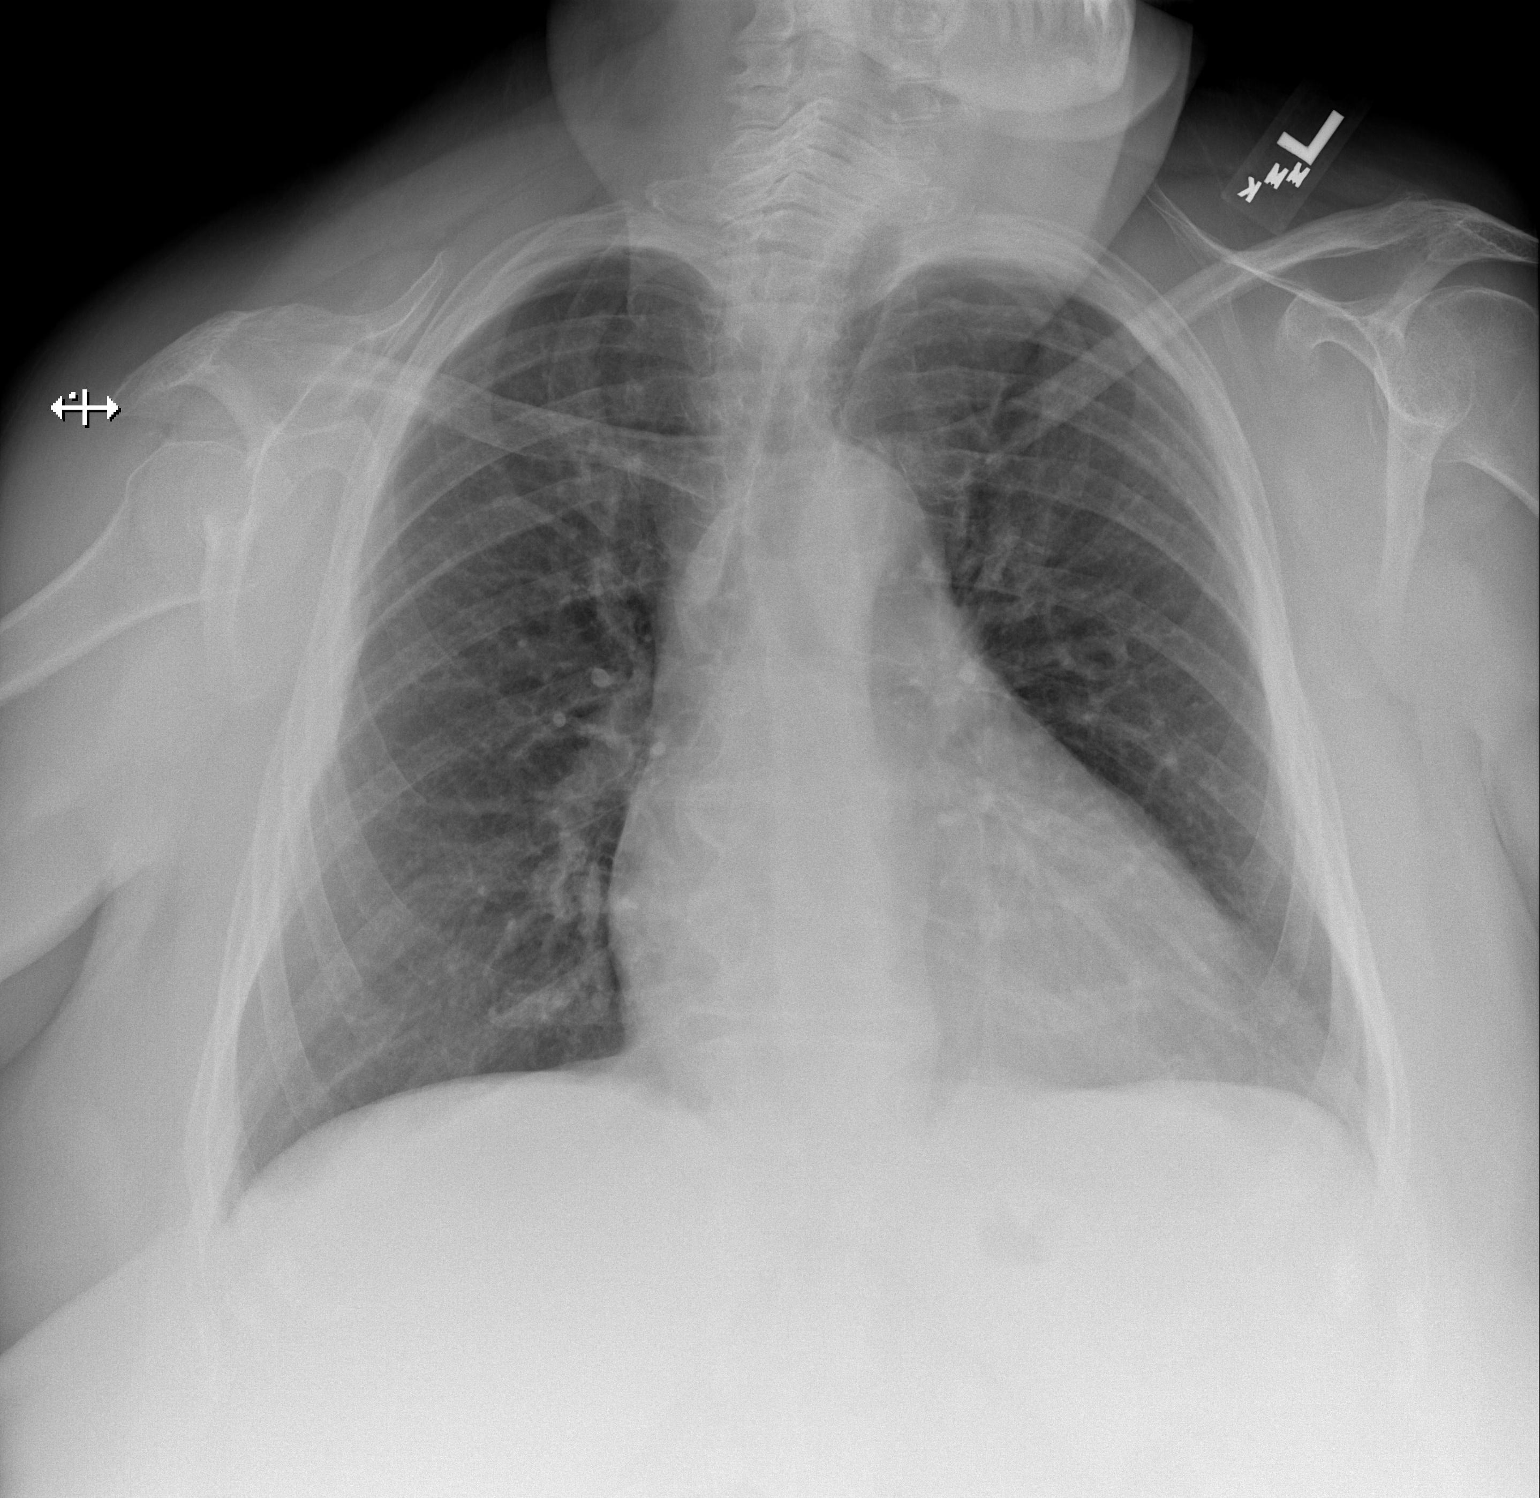

[w chest lat]
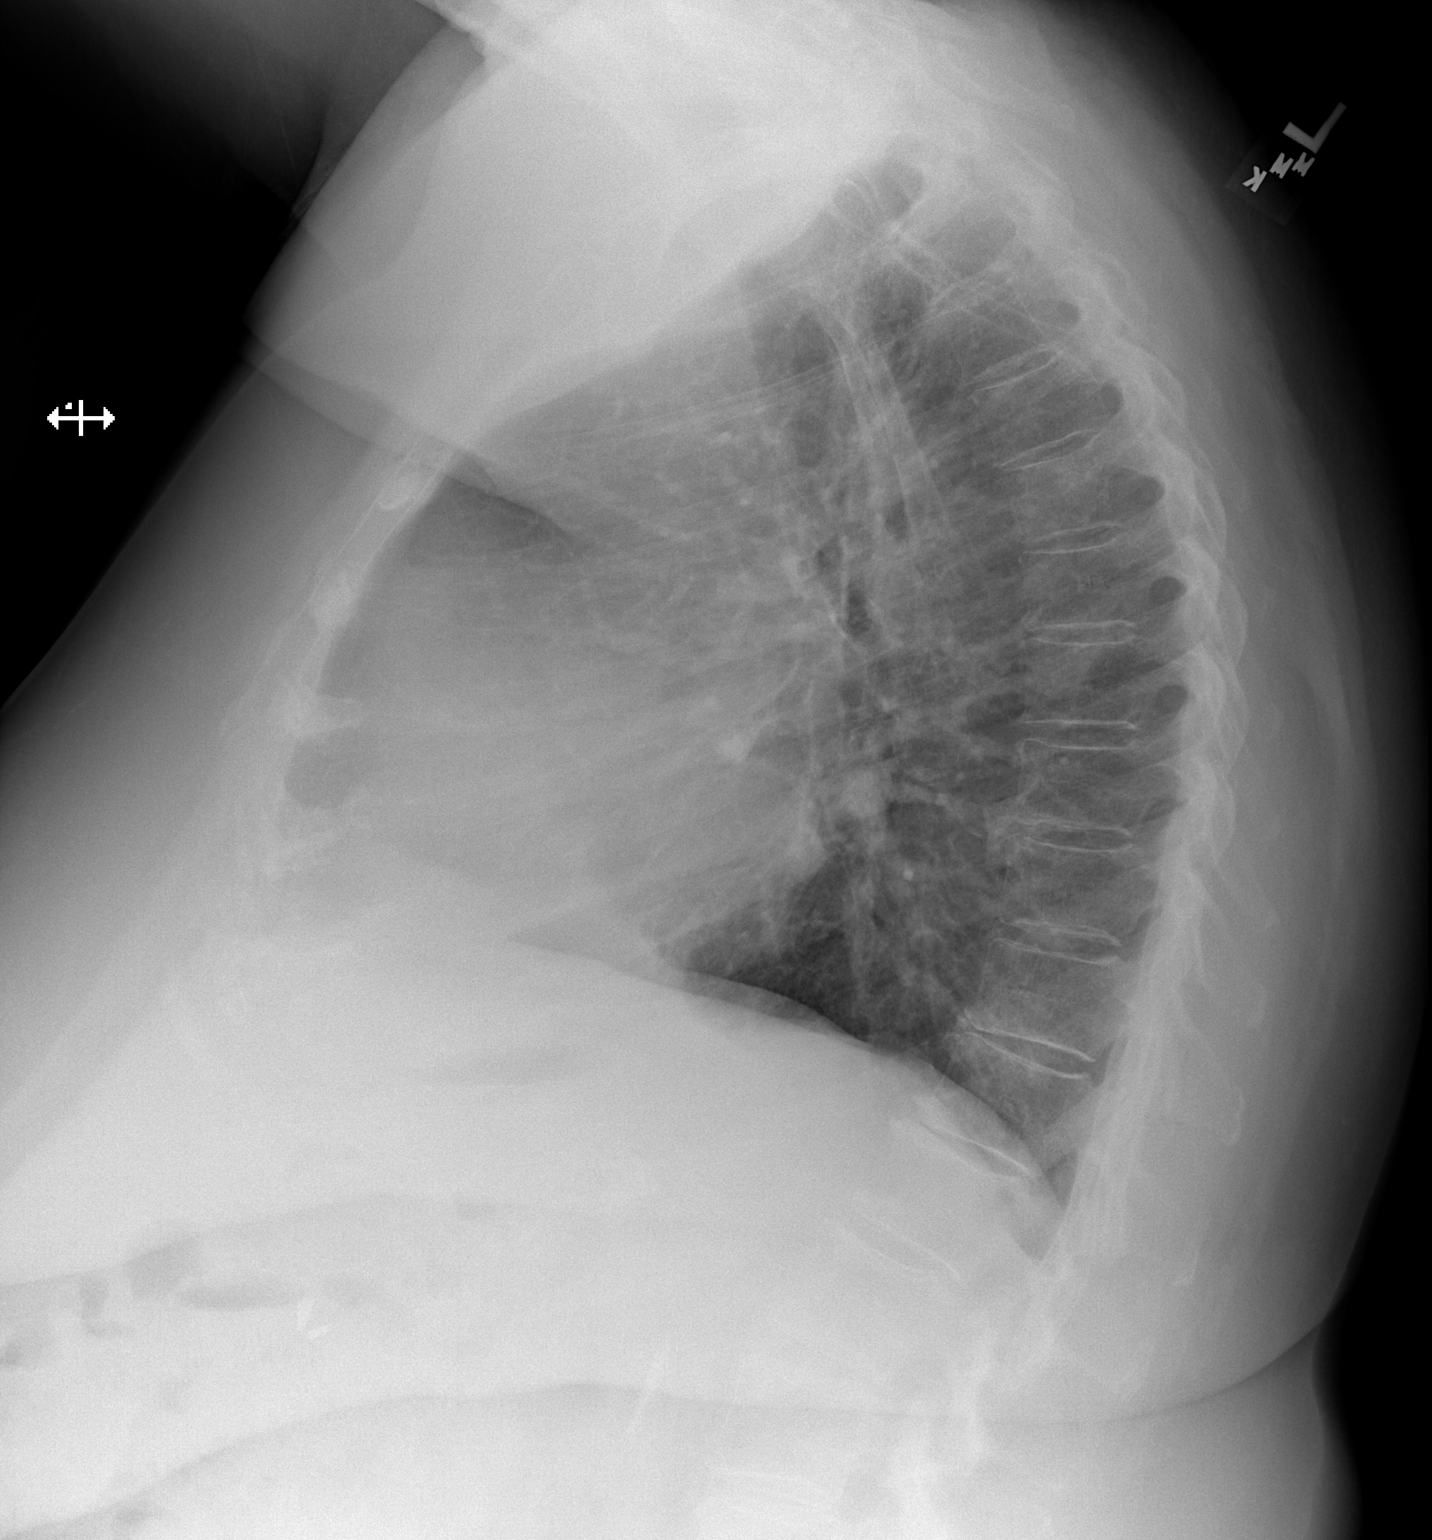

[2 of 2 positions shown; findings below may reference images not displayed]

FINDINGS: Mildly enlarged cardiac silhouette. Mildly prominent pulmonary
vasculature. Clear lungs. Thoracic spine degenerative changes. Mild
abdominal aortic calcification. Cholecystectomy clips.
IMPRESSION: 1. Mild cardiomegaly and mild pulmonary vascular congestion.
2. Aortic atherosclerosis.

## 2022-02-26 DEATH — deceased
